# Patient Record
Sex: Female | Born: 1985 | Race: White | Hispanic: No | Smoking: Never smoker
Health system: Southern US, Community
[De-identification: ages and names within clinical notes are randomized; demographics above are authoritative.]

## PROBLEM LIST (undated history)

## (undated) DIAGNOSIS — T7421XA Adult sexual abuse, confirmed, initial encounter: Secondary | ICD-10-CM

## (undated) DIAGNOSIS — J309 Allergic rhinitis, unspecified: Secondary | ICD-10-CM

## (undated) DIAGNOSIS — J45909 Unspecified asthma, uncomplicated: Secondary | ICD-10-CM

## (undated) DIAGNOSIS — F419 Anxiety disorder, unspecified: Secondary | ICD-10-CM

## (undated) DIAGNOSIS — T7840XA Allergy, unspecified, initial encounter: Secondary | ICD-10-CM

## (undated) DIAGNOSIS — R45851 Suicidal ideations: Secondary | ICD-10-CM

## (undated) DIAGNOSIS — N809 Endometriosis, unspecified: Secondary | ICD-10-CM

## (undated) DIAGNOSIS — T1491XA Suicide attempt, initial encounter: Secondary | ICD-10-CM

## (undated) DIAGNOSIS — T7412XA Child physical abuse, confirmed, initial encounter: Secondary | ICD-10-CM

## (undated) DIAGNOSIS — F32A Depression, unspecified: Secondary | ICD-10-CM

## (undated) HISTORY — PX: TOTAL ABDOMINAL HYSTERECTOMY W/ BILATERAL SALPINGOOPHORECTOMY: SHX83

## (undated) HISTORY — PX: DILATION AND CURETTAGE OF UTERUS: SHX78

## (undated) HISTORY — DX: Unspecified asthma, uncomplicated: J45.909

## (undated) HISTORY — DX: Allergy, unspecified, initial encounter: T78.40XA

## (undated) HISTORY — DX: Suicide attempt, initial encounter: T14.91XA

## (undated) HISTORY — PX: OTHER SURGICAL HISTORY: SHX169

## (undated) HISTORY — DX: Anxiety disorder, unspecified: F41.9

---
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
## 2006-01-20 ENCOUNTER — Emergency Department (HOSPITAL_COMMUNITY): Admission: EM | Admit: 2006-01-20 | Discharge: 2006-01-20 | Payer: Self-pay | Admitting: Emergency Medicine

## 2006-07-27 ENCOUNTER — Emergency Department (HOSPITAL_COMMUNITY): Admission: EM | Admit: 2006-07-27 | Discharge: 2006-07-27 | Payer: Self-pay | Admitting: Emergency Medicine

## 2006-07-29 ENCOUNTER — Emergency Department (HOSPITAL_COMMUNITY): Admission: EM | Admit: 2006-07-29 | Discharge: 2006-07-29 | Payer: Self-pay | Admitting: Emergency Medicine

## 2006-08-26 ENCOUNTER — Emergency Department (HOSPITAL_COMMUNITY): Admission: EM | Admit: 2006-08-26 | Discharge: 2006-08-26 | Payer: Self-pay | Admitting: Emergency Medicine

## 2006-09-01 ENCOUNTER — Emergency Department (HOSPITAL_COMMUNITY): Admission: EM | Admit: 2006-09-01 | Discharge: 2006-09-01 | Payer: Self-pay | Admitting: Emergency Medicine

## 2007-05-07 ENCOUNTER — Emergency Department (HOSPITAL_COMMUNITY): Admission: EM | Admit: 2007-05-07 | Discharge: 2007-05-07 | Payer: Self-pay | Admitting: Emergency Medicine

## 2007-06-03 ENCOUNTER — Ambulatory Visit (HOSPITAL_COMMUNITY): Admission: RE | Admit: 2007-06-03 | Discharge: 2007-06-03 | Payer: Self-pay | Admitting: Neurology

## 2007-06-09 ENCOUNTER — Emergency Department (HOSPITAL_COMMUNITY): Admission: EM | Admit: 2007-06-09 | Discharge: 2007-06-09 | Payer: Self-pay | Admitting: Emergency Medicine

## 2007-06-21 ENCOUNTER — Emergency Department (HOSPITAL_COMMUNITY): Admission: EM | Admit: 2007-06-21 | Discharge: 2007-06-21 | Payer: Self-pay | Admitting: Emergency Medicine

## 2007-12-25 ENCOUNTER — Emergency Department (HOSPITAL_COMMUNITY): Admission: EM | Admit: 2007-12-25 | Discharge: 2007-12-26 | Payer: Self-pay | Admitting: Emergency Medicine
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
## 2010-06-28 NOTE — Discharge Summary (Signed)
NAMEWadie Medina NO.:  [id number]   MEDICAL RECORD NO.:  [id number]          PATIENT TYPE:  IPS   LOCATION:  0504                          FACILITY:  [facility]   PHYSICIAN:  Jeanice Lim, M.D. DATE OF BIRTH:  January 25, 1986   DATE OF ADMISSION:  12/13/2004  DATE OF DISCHARGE:  12/17/2004                                 DISCHARGE SUMMARY   IDENTIFYING DATA:  This is an 25 year old single Caucasian female who  apparently advised her mother to take her to the emergency department at  [facility] yesterday.  She reported that she felt that she had a  problem with pills.  Her mother wanted to know if she could not wait until  her appointment at [facility] on December 25, 2004, and patient  stated no.  She was feeling suicidal and admitted in the emergency room of a  plan to wreck her car.   PAST PSYCHIATRIC HISTORY:  Conflicting information.  Reports she has been in  mental health since age 33 and has seen Dr. Wynonia Lawman and Alesia Banda, his therapist.  She was prescribed Remeron and trazodone in the past.   ALCOHOL AND DRUG HISTORY:  Codeine, using at age 70, 2-3 times per week.  Hydrocodone, Adderall, CCC, hydrocodone off the street.  Abusing multiple  medications and pills.   PRIMARY CARE PHYSICIAN:  Dr. Charm Barges in [place].   MEDICATIONS:  Denied being prescribed any at this time.   ALLERGIES:  No known drug allergies.   PHYSICAL EXAMINATION:  Physical and neurologic exam essentially within  normal limits.   MENTAL STATUS EXAM:  Mood somewhat depressed, anxious.  Fair hygiene, no  psychomotor abnormalities except for some restlessness.  Thought process  goal directed.  Judgment and insight are fair.  Patient reported suicidal  thoughts, reported that she states God talks to her at times and maybe has a  history of mood swings.  Cognition was intact.  Judgment and insight were  impaired.   ADMISSION DIAGNOSES:  AXIS I:  Possible bipolar disorder,  type 2.  Questionable post traumatic stress disorder.  Rule out depression not  otherwise specified.  Polysubstance abuse.  Possible substance-induced mood  disorder.  AXIS II:  Deferred.  AXIS III:  History of a headache.  AXIS IV:  Moderate stressors with educational, occupational, housing and  primary support system issues.  AXIS V:  30/55.   HOSPITAL COURSE:  Patient was admitted, ordered routine p.r.n. medications,  underwent further monitoring, was encouraged to participate in individual,  group and milieu therapy.  Patient was optimized on medications and started  on Lamictal targeting mood instability and recurrent mood episodes and clear  history of mood swings which the patient reports, as well as low-dose  Risperdal which is decreased further to minimize sedation.  Patient reported  a positive response to medication changes and crisis intervention.  She  showed improvement in insight, was appropriate on the unit participation in  group, sleeping and eating improved.  Patient was discharged in stable  condition.  Mood was euthymic.  Affect full.  No dangerous ideation or  psychotic symptoms, feeling motivated to get into a substance abuse  treatment program, had to go through a substance abuse assessment now at  [facility] on the day after discharge and then was  planning on supervision from the family until patient could get directly  into a residential program.  Patient's ambivalence and prognosis are  somewhat guarded since she is not directly going to the program.  She is  aware of this risk; however, there is no other way to get around the  community resources.  Patient was given medication education including risk  of rash and to stop medication immediately and call doctor if this occurs  due to risk of Steven-Johnson syndrome with Lamictal.   DISCHARGE MEDICATIONS:  1.  Lamictal 25 mg q.a.m. x 1 week and then 2 tablets q.a.m.  2.  Risperdal 0.25  mg at 8:00 p.m.  3.  Motrin 600 mg 1 q.12 h. p.r.n. pain.  4.  Vistaril 50 mg q.8 h. p.r.n. severe anxiety.   DISPOSITION:  Patient was discharged in improved condition.   DISCHARGE DIAGNOSES:  AXIS I:  Possible bipolar disorder, type 2.  Questionable post traumatic stress disorder.  Rule out depression not  otherwise specified.  Polysubstance abuse.  Possible substance-induced mood  disorder.  AXIS II:  Deferred.  AXIS III:  History of a headache.  AXIS IV:  Moderate stressors with educational, occupational, housing and  primary support system issues.  AXIS V:  Global assessment of function on discharge 55.      Jeanice Lim, M.D.  Electronically Signed     JEM/MEDQ  D:  12/21/2004  T:  12/22/2004  Job:  [id number]

## 2010-06-28 NOTE — Discharge Summary (Signed)
NAMEWadie Medina NO.:  [id number]   MEDICAL RECORD NO.:  [id number]          PATIENT TYPE:  IPS   LOCATION:  0307                          FACILITY:  [facility]   PHYSICIAN:  Jeanice Lim, M.D. DATE OF BIRTH:  1985/12/24   DATE OF ADMISSION:  12/27/2004  DATE OF DISCHARGE:  12/30/2004                                 DISCHARGE SUMMARY   IDENTIFYING DATA:  This is an 25 year old single Caucasian female  voluntarily admitted.  Presented with a history of a breakdown.  Overwhelmed, having thoughts of suicide, cutting self with a razor.  Had a  history of Adderall, triple C and polysubstance abuse with pills, Ambien,  anything that she could get.  She would snort these and take as many as she  can get, take them from her family, get them from friends and urine drug  screen was positive for benzodiazepines although she has no explanation for  this.  It is possible that her detox previously, a week and a half to two  weeks ago, that the long-acting Librium may still have been in her system  but this is unclear.  The patient does emphatically deny having relapsed.  However, came back into the hospital due to mood instability and had not  started intensive outpatient or substance abuse residential treatment due to  the county having slowly set this up.  Her first IOP was to be scheduled  this coming Monday and unclear when she will get into residential if she  will get in.  This is the second admission again at [facility].  Detoxed previously.  History of cutting self in the past.  Lives  with foster parents, in first semester.  Wanted to get out but needs to have  money to move and live in dorm.  The patient continued to report motivation  to remain abstinent and described some mood instability with primary  depressive symptoms since being discharge.  She did well on __________ but  then the bottom dropped out.  Again, denied any substance use and  patient  did appear to be motivated to remain abstinent although her support system  and living situation as well as coping skills and tools at this point  regarding a relapse prevention plan are somewhat questionable and patient's  prognosis would be guarded in light of lack of a more intensive substance  use treatment plan being available such as residential treatment.   PHYSICAL EXAMINATION:  The patient had a physical and neurologic exam done  at [facility] essentially within normal limits except superficial  laceration to right arm.   MENTAL STATUS EXAM:  Alert, cooperative, young female.  Casually dressed.  Speech clear.  Mood and affect anxious.  Appearing depressed.  Thought  processes goal directed.  No overt psychosis.  No acute suicidal or  homicidal ideation.  Judgment and insight were impaired and patient was a  somewhat poor historian with a history of very poor impulse control.   ADMISSION DIAGNOSES:  AXIS I:  Bipolar disorder, type 2.  Possible attention-  deficit disorder.  Polysubstance  abuse in early remission.  AXIS II:  Deferred.  AXIS III:  None.  AXIS IV:  Moderate (problems with education, psychosocial issues and support  system was quite limited).  AXIS V:  35/60.   HOSPITAL COURSE:  The patient was admitted and resumed on medications after  reconciliation and monitored for safety.  Medications optimized.  The  patient participated in dual diagnosis to develop healthier coping skills  and to work on a schedule and relapse prevention plan which will allow her  to remain abstinent but stable outside of the hospital.  Again, mental  health center was pursued regarding the possibility of getting her into  residential program but this was not available for her to go directly go  into.  Therefore, she would be starting intensive outpatient the day after  discharge.  She was angry at her mother for telling her that she would take  her to NA meetings, was  angry that she was not able to take the car other  places which of course would not be advisable for this patient and clearly  needs significant supervision based on her maturity level and just early  stage of attempting to learn how to maintain sobriety.  The patient  responded to crisis intervention and stabilization.   CONDITION ON DISCHARGE:  Discharged in improved condition.  Mood was  euthymic.  Affect bright.  The patient appeared to be able to cope, was  calm, had a plan, a busy schedule and routine and reported motivated to  remain abstinent and was willing to go to residential as soon as one might  be available for substance abuse treatment.  Therefore, the patient was  discharged in improved condition with no risk issues, not requiring detox, a  second admission.  Medications had been optimized and patient was  recommended to have medication management for mood instability in addition  to therapy in addition to substance abuse treatment which were arranged  prior to discharge.  She was discharged in improved condition.   DISCHARGE DIAGNOSES:  AXIS I:  Bipolar disorder, type 2.  Possible attention-  deficit disorder.  Polysubstance abuse in early remission.  AXIS II:  Deferred.  AXIS III:  None.  AXIS IV:  Moderate (problems with education, psychosocial issues and support  system was quite limited).  AXIS V:  GAF on discharge 60.      Jeanice Lim, M.D.  Electronically Signed     JEM/MEDQ  D:  01/05/2005  T:  01/05/2005  Job:  [id number]

## 2010-11-04 LAB — WET PREP, GENITAL
Trich, Wet Prep: NONE SEEN
Yeast Wet Prep HPF POC: NONE SEEN

## 2010-11-04 LAB — URINALYSIS, ROUTINE W REFLEX MICROSCOPIC
Bilirubin Urine: NEGATIVE
Glucose, UA: NEGATIVE
Hgb urine dipstick: NEGATIVE
Ketones, ur: NEGATIVE
Specific Gravity, Urine: 1.025
pH: 6

## 2010-11-04 LAB — URINE MICROSCOPIC-ADD ON

## 2010-11-04 LAB — GC/CHLAMYDIA PROBE AMP, GENITAL
Chlamydia, DNA Probe: NEGATIVE
GC Probe Amp, Genital: NEGATIVE

## 2010-11-04 LAB — PREGNANCY, URINE: Preg Test, Ur: NEGATIVE

## 2010-11-13 LAB — URINALYSIS, ROUTINE W REFLEX MICROSCOPIC
Glucose, UA: NEGATIVE
Ketones, ur: 40 — AB
Protein, ur: 100 — AB
pH: 6

## 2010-11-13 LAB — URINE CULTURE

## 2010-11-13 LAB — URINE MICROSCOPIC-ADD ON

## 2010-11-25 LAB — URINALYSIS, ROUTINE W REFLEX MICROSCOPIC
Hgb urine dipstick: NEGATIVE
Ketones, ur: 15 — AB
Leukocytes, UA: NEGATIVE
Nitrite: POSITIVE — AB
Protein, ur: 30 — AB
Specific Gravity, Urine: 1.02
Urobilinogen, UA: 0.2
Urobilinogen, UA: 4 — ABNORMAL HIGH

## 2010-11-25 LAB — GC/CHLAMYDIA PROBE AMP, GENITAL: GC Probe Amp, Genital: NEGATIVE

## 2010-11-25 LAB — URINE CULTURE: Colony Count: 6000

## 2010-11-25 LAB — CBC
HCT: 38.4
Hemoglobin: 13
MCV: 86.6
Platelets: 259
RBC: 4.44
WBC: 7.6

## 2010-11-25 LAB — COMPREHENSIVE METABOLIC PANEL
Albumin: 4.1
Alkaline Phosphatase: 62
BUN: 8
CO2: 28
Chloride: 103
GFR calc non Af Amer: >60
Potassium: 4.4
Total Bilirubin: 0.7

## 2010-11-25 LAB — DIFFERENTIAL
Basophils Absolute: 0
Basophils Relative: 1
Eosinophils Relative: 0
Monocytes Absolute: 0.4
Neutro Abs: 6.3

## 2010-11-25 LAB — PREGNANCY, URINE: Preg Test, Ur: POSITIVE

## 2010-11-25 LAB — HCG, QUANTITATIVE, PREGNANCY: hCG, Beta Chain, Quant, S: 45032 — ABNORMAL HIGH

## 2010-11-25 LAB — URINE MICROSCOPIC-ADD ON

## 2010-11-25 LAB — WET PREP, GENITAL: Yeast Wet Prep HPF POC: NONE SEEN

## 2010-11-27 LAB — URINE MICROSCOPIC-ADD ON

## 2010-11-27 LAB — URINALYSIS, ROUTINE W REFLEX MICROSCOPIC
Glucose, UA: NEGATIVE
Ketones, ur: NEGATIVE
Protein, ur: 100 — AB

## 2020-02-25 ENCOUNTER — Inpatient Hospital Stay
Admission: EM | Admit: 2020-02-25 | Discharge: 2020-02-29 | DRG: 917 | Disposition: A | Discharge status: Home / Self Care | Payer: Self-pay | Attending: Internal Medicine | Admitting: Internal Medicine

## 2020-02-25 ENCOUNTER — Encounter: Payer: Self-pay | Admitting: Internal Medicine

## 2020-02-25 ENCOUNTER — Other Ambulatory Visit: Payer: Self-pay

## 2020-02-25 DIAGNOSIS — T1491XA Suicide attempt, initial encounter: Secondary | ICD-10-CM

## 2020-02-25 DIAGNOSIS — F331 Major depressive disorder, recurrent, moderate: Secondary | ICD-10-CM

## 2020-02-25 DIAGNOSIS — F332 Major depressive disorder, recurrent severe without psychotic features: Secondary | ICD-10-CM

## 2020-02-25 DIAGNOSIS — R45851 Suicidal ideations: Secondary | ICD-10-CM

## 2020-02-25 DIAGNOSIS — F32A Depression, unspecified: Secondary | ICD-10-CM

## 2020-02-25 DIAGNOSIS — F603 Borderline personality disorder: Secondary | ICD-10-CM | POA: Diagnosis present

## 2020-02-25 DIAGNOSIS — F4310 Post-traumatic stress disorder, unspecified: Secondary | ICD-10-CM | POA: Diagnosis present

## 2020-02-25 DIAGNOSIS — Z90710 Acquired absence of both cervix and uterus: Secondary | ICD-10-CM

## 2020-02-25 DIAGNOSIS — T391X2A Poisoning by 4-Aminophenol derivatives, intentional self-harm, initial encounter: Principal | ICD-10-CM | POA: Diagnosis present

## 2020-02-25 DIAGNOSIS — Z9151 Personal history of suicidal behavior: Secondary | ICD-10-CM

## 2020-02-25 DIAGNOSIS — T50902A Poisoning by unspecified drugs, medicaments and biological substances, intentional self-harm, initial encounter: Secondary | ICD-10-CM

## 2020-02-25 DIAGNOSIS — Z79890 Hormone replacement therapy (postmenopausal): Secondary | ICD-10-CM

## 2020-02-25 DIAGNOSIS — Z79899 Other long term (current) drug therapy: Secondary | ICD-10-CM

## 2020-02-25 DIAGNOSIS — U071 COVID-19: Secondary | ICD-10-CM | POA: Diagnosis present

## 2020-02-25 DIAGNOSIS — Z5901 Sheltered homelessness: Secondary | ICD-10-CM

## 2020-02-25 DIAGNOSIS — Z91410 Personal history of adult physical and sexual abuse: Secondary | ICD-10-CM

## 2020-02-25 HISTORY — DX: Allergic rhinitis, unspecified: J30.9

## 2020-02-25 HISTORY — DX: Depression, unspecified: F32.A

## 2020-02-25 HISTORY — DX: Adult sexual abuse, confirmed, initial encounter: T74.21XA

## 2020-02-25 HISTORY — DX: Child physical abuse, confirmed, initial encounter: T74.12XA

## 2020-02-25 HISTORY — DX: Suicidal ideations: R45.851

## 2020-02-25 HISTORY — DX: Endometriosis, unspecified: N80.9

## 2020-02-25 LAB — CBC WITH DIFFERENTIAL/PLATELET
Abs Immature Granulocytes: 0.02 10*3/uL (ref 0.00–0.07)
Basophils Absolute: 0 10*3/uL (ref 0.0–0.1)
Basophils Relative: 0 %
Eosinophils Absolute: 0.1 10*3/uL (ref 0.0–0.5)
Eosinophils Relative: 1 %
HCT: 38.5 % (ref 36.0–46.0)
Hemoglobin: 12.8 g/dL (ref 12.0–15.0)
Immature Granulocytes: 0 %
Lymphocytes Relative: 30 %
Lymphs Abs: 1.6 10*3/uL (ref 0.7–4.0)
MCH: 29.8 pg (ref 26.0–34.0)
MCHC: 33.2 g/dL (ref 30.0–36.0)
MCV: 89.7 fL (ref 80.0–100.0)
Monocytes Absolute: 0.5 10*3/uL (ref 0.1–1.0)
Monocytes Relative: 9 %
Neutro Abs: 3.3 10*3/uL (ref 1.7–7.7)
Neutrophils Relative %: 60 %
Platelets: 223 10*3/uL (ref 150–400)
RBC: 4.29 MIL/uL (ref 3.87–5.11)
RDW: 12.3 % (ref 11.5–15.5)
WBC: 5.5 10*3/uL (ref 4.0–10.5)
nRBC: 0 % (ref 0.0–0.2)

## 2020-02-25 LAB — BLOOD GAS, VENOUS
Acid-Base Excess: 3.5 mmol/L — ABNORMAL HIGH (ref 0.0–2.0)
Bicarbonate: 29.1 mmol/L — ABNORMAL HIGH (ref 20.0–28.0)
O2 Saturation: 78.6 %
Patient temperature: 37
pCO2, Ven: 47 mmHg (ref 44.0–60.0)
pH, Ven: 7.4 (ref 7.250–7.430)
pO2, Ven: 43 mmHg (ref 32.0–45.0)

## 2020-02-25 LAB — COMPREHENSIVE METABOLIC PANEL
ALT: 30 U/L (ref 0–44)
AST: 33 U/L (ref 15–41)
Albumin: 4.3 g/dL (ref 3.5–5.0)
Alkaline Phosphatase: 81 U/L (ref 38–126)
Anion gap: 13 (ref 5–15)
BUN: 14 mg/dL (ref 6–20)
CO2: 25 mmol/L (ref 22–32)
Calcium: 9.4 mg/dL (ref 8.9–10.3)
Chloride: 103 mmol/L (ref 98–111)
Creatinine, Ser: 0.72 mg/dL (ref 0.44–1.00)
GFR, Estimated: >60 mL/min (ref >60)
Glucose, Bld: 142 mg/dL — ABNORMAL HIGH (ref 70–99)
Potassium: 3.4 mmol/L — ABNORMAL LOW (ref 3.5–5.1)
Sodium: 141 mmol/L (ref 135–145)
Total Bilirubin: 0.6 mg/dL (ref 0.3–1.2)
Total Protein: 7.9 g/dL (ref 6.5–8.1)

## 2020-02-25 LAB — RESP PANEL BY RT-PCR (FLU A&B, COVID) ARPGX2
Influenza A by PCR: NEGATIVE
Influenza B by PCR: NEGATIVE
SARS Coronavirus 2 by RT PCR: POSITIVE — AB

## 2020-02-25 LAB — URINALYSIS, COMPLETE (UACMP) WITH MICROSCOPIC
Bacteria, UA: NONE SEEN
Bilirubin Urine: NEGATIVE
Glucose, UA: NEGATIVE mg/dL
Hgb urine dipstick: NEGATIVE
Ketones, ur: NEGATIVE mg/dL
Leukocytes,Ua: NEGATIVE
Nitrite: NEGATIVE
Protein, ur: NEGATIVE mg/dL
Specific Gravity, Urine: 1.015 (ref 1.005–1.030)
pH: 5 (ref 5.0–8.0)

## 2020-02-25 LAB — URINE DRUG SCREEN, QUALITATIVE (ARMC ONLY)
Amphetamines, Ur Screen: NOT DETECTED
Barbiturates, Ur Screen: NOT DETECTED
Benzodiazepine, Ur Scrn: NOT DETECTED
Cannabinoid 50 Ng, Ur ~~LOC~~: NOT DETECTED
Cocaine Metabolite,Ur ~~LOC~~: NOT DETECTED
MDMA (Ecstasy)Ur Screen: NOT DETECTED
Methadone Scn, Ur: NOT DETECTED
Opiate, Ur Screen: NOT DETECTED
Phencyclidine (PCP) Ur S: NOT DETECTED
Tricyclic, Ur Screen: NOT DETECTED

## 2020-02-25 LAB — ACETAMINOPHEN LEVEL
Acetaminophen (Tylenol), Serum: 220 ug/mL (ref 10–30)
Acetaminophen (Tylenol), Serum: 273 ug/mL (ref 10–30)

## 2020-02-25 LAB — PREGNANCY, URINE: Preg Test, Ur: NEGATIVE

## 2020-02-25 LAB — POC URINE PREG, ED: Preg Test, Ur: NEGATIVE

## 2020-02-25 LAB — LACTIC ACID, PLASMA: Lactic Acid, Venous: 2.6 mmol/L (ref 0.5–1.9)

## 2020-02-25 LAB — SALICYLATE LEVEL
Salicylate Lvl: <7 mg/dL — ABNORMAL LOW (ref 7.0–30.0)
Salicylate Lvl: <7 mg/dL — ABNORMAL LOW (ref 7.0–30.0)

## 2020-02-25 MED ORDER — ONDANSETRON HCL 4 MG/2ML IJ SOLN
4.0000 mg | Freq: Once | INTRAMUSCULAR | Status: AC
  Administered 2020-02-25: 4 mg via INTRAVENOUS
  Filled 2020-02-25: qty 2

## 2020-02-25 MED ORDER — ACETYLCYSTEINE LOAD VIA INFUSION: 150.0000 mg/kg | Freq: Once | INTRAVENOUS | Status: DC

## 2020-02-25 MED ORDER — CITALOPRAM HYDROBROMIDE 20 MG PO TABS
20.0000 mg | ORAL_TABLET | Freq: Every day | ORAL | Status: DC
  Administered 2020-02-26 – 2020-02-29 (×4): 20 mg via ORAL
  Filled 2020-02-25 (×4): qty 1

## 2020-02-25 MED ORDER — ESTRADIOL 1 MG PO TABS: 0.5000 mg | ORAL_TABLET | Freq: Every day | ORAL | Status: DC

## 2020-02-25 MED ORDER — ACETYLCYSTEINE LOAD VIA INFUSION
150.0000 mg/kg | Freq: Once | INTRAVENOUS | Status: AC
  Administered 2020-02-26: 10890 mg via INTRAVENOUS
  Filled 2020-02-25: qty 273

## 2020-02-25 MED ORDER — NALOXONE HCL 2 MG/2ML IJ SOSY
PREFILLED_SYRINGE | INTRAMUSCULAR | Status: AC
  Filled 2020-02-25: qty 2

## 2020-02-25 MED ORDER — DEXTROSE 5 % IV SOLN
15.0000 mg/kg/h | INTRAVENOUS | Status: DC
  Administered 2020-02-26 (×2): 15 mg/kg/h via INTRAVENOUS
  Filled 2020-02-25 (×2): qty 120

## 2020-02-25 MED ORDER — ESTRADIOL 1 MG PO TABS
1.0000 mg | ORAL_TABLET | Freq: Every day | ORAL | Status: DC
  Administered 2020-02-26 – 2020-02-29 (×4): 1 mg via ORAL
  Filled 2020-02-25 (×4): qty 1

## 2020-02-25 MED ORDER — SODIUM CHLORIDE 0.9 % IV BOLUS
1000.0000 mL | Freq: Once | INTRAVENOUS | Status: AC
  Administered 2020-02-25: 1000 mL via INTRAVENOUS

## 2020-02-25 NOTE — ED Notes (Signed)
Date and time results received: 02/25/20 11:53 PM   Test: Covid Critical Value: positive  Name of Provider Notified: Norins MD

## 2020-02-25 NOTE — ED Notes (Signed)
Pt brought to room in wheelchair with PD. Unresponsive with eyes open. Blink reflex intact. Would not respond to painful stimuli. EDP at bedside.

## 2020-02-25 NOTE — ED Triage Notes (Signed)
Pt here with PD under IVC. Per PD pt was witnessed taking "a few handfuls of Tylenol."

## 2020-02-25 NOTE — ED Notes (Signed)
Pt placed on bedpan. Bedpan spilled. Pt placed in clean dry gown, linens changed. Urine collected and sent to lab

## 2020-02-25 NOTE — ED Notes (Signed)
Pt talking and answering questions

## 2020-02-25 NOTE — ED Provider Notes (Signed)
[facility] Emergency Department Provider Note   ____________________________________________   None    (approximate)  I have reviewed the triage vital signs and the nursing notes.   HISTORY  Chief Complaint Drug Overdose  Chief complaint is overdose  HPI Rebecca Medina is a 35 y.o. female brought in by police.  Patient was seen by friends to take a large amount of Tylenol police were called at 13.  This overdose happened just before that.  Patient is now staring at ceiling will blink to threat and occasionally will pick her arm up and wipe her mouth where she was drooling but otherwise does not want to talk.         No past medical history on file.  There are no problems to display for this patient.     Prior to Admission medications   Not on File    Allergies Patient has no allergy information on record.  No family history on file.  Social History    Review of Systems Unable to get this part of the HPI because of patient's lack of responsiveness  ____________________________________________   PHYSICAL EXAM:  VITAL SIGNS: ED Triage Vitals  Enc Vitals Group     BP      Pulse      Resp      Temp      Temp src      SpO2      Weight      Height      Head Circumference      Peak Flow      Pain Score      Pain Loc      Pain Edu?      Excl. in GC?     Constitutional: Patient's eyes are open she blinks to threat but otherwise will not talk Eyes: Conjunctivae are normal. PERRL. EOMI. Head: Atraumatic. Nose: No congestion/rhinnorhea. Mouth/Throat: Mucous membranes are moist.  Oropharynx non-erythematous. Neck: No stridor.  Cardiovascular: Normal rate, regular rhythm. Grossly normal heart sounds.  Good peripheral circulation. Respiratory: Normal respiratory effort.  No retractions. Lungs CTAB. Gastrointestinal: Soft and nontender. No distention. No abdominal bruits.  Musculoskeletal: No lower extremity tenderness nor  edema.   Neurologic: See HPI patient does not respond to pain Skin:  Skin is warm, dry and intact. No rash noted.   ____________________________________________   LABS (all labs ordered are listed, but only abnormal results are displayed)  Labs Reviewed  COMPREHENSIVE METABOLIC PANEL - Abnormal; Notable for the following components:      Result Value   Potassium 3.4 (*)    Glucose, Bld 142 (*)    All other components within normal limits  SALICYLATE LEVEL - Abnormal; Notable for the following components:   Salicylate Lvl <7.0 (*)    All other components within normal limits  ACETAMINOPHEN LEVEL - Abnormal; Notable for the following components:   Acetaminophen (Tylenol), Serum 220 (*)    All other components within normal limits  LACTIC ACID, PLASMA - Abnormal; Notable for the following components:   Lactic Acid, Venous 2.6 (*)    All other components within normal limits  BLOOD GAS, VENOUS - Abnormal; Notable for the following components:   Bicarbonate 29.1 (*)    Acid-Base Excess 3.5 (*)    All other components within normal limits  ACETAMINOPHEN LEVEL - Abnormal; Notable for the following components:   Acetaminophen (Tylenol), Serum 273 (*)    All other components within normal limits  SALICYLATE LEVEL -  Abnormal; Notable for the following components:   Salicylate Lvl <7.0 (*)    All other components within normal limits  RESP PANEL BY RT-PCR (FLU A&B, COVID) ARPGX2  CBC WITH DIFFERENTIAL/PLATELET  LACTIC ACID, PLASMA  URINE DRUG SCREEN, QUALITATIVE ([facility] ONLY)  URINALYSIS, COMPLETE (UACMP) WITH MICROSCOPIC  PREGNANCY, URINE  POC URINE PREG, ED   ____________________________________________  EKG   ____________________________________________  RADIOLOGY Jill Poling, personally viewed and evaluated these images (plain radiographs) as part of my medical decision making, as well as reviewing the written report by the radiologist.  ED MD interpretation:    Official radiology report(s): No results found.  ____________________________________________   PROCEDURES  Procedure(s) performed (including Critical Care): Critical care time 45 minutes.  This includes talking to the patient at about 1045 and initially evaluating the patient when she would not talk.  I also talked to poison control twice and discussed the care with the nurse and then I am going to discussed care with the hospitalist.  Procedures   ____________________________________________   INITIAL IMPRESSION / ASSESSMENT AND PLAN / ED COURSE  Because of overdose and apparent noncompliance I will take out commitment papers Poison control contacted with 2-hour levels. They suggest waiting for 4-hour levels before we do anything.    The patient has been placed in psychiatric observation due to the need to provide a safe environment for the patient while obtaining psychiatric consultation and evaluation, as well as ongoing medical and medication management to treat the patient's condition.  The patient has been placed under full IVC at this time.   Patient will need medical admission.  Her 4-hour Tylenol level is 273.  Discussed this with the [facility]  Patient has been vomiting.  We will give her 150 mg/kg bolus over an hour and then a 15 mg/kg/h bolus for 23 hours.  Repeat labs 1 hour before the drip ends.  Patient is under psychiatric commitment.  When she is medically cleared she will need psychiatric evaluation.      ____________________________________________   FINAL CLINICAL IMPRESSION(S) / ED DIAGNOSES  Final diagnoses:  Intentional drug overdose, initial encounter (HCC)  Suicide attempt (HCC)  Tylenol overdose, intentional self-harm, initial encounter [facility])     ED Discharge Orders    None      *Please note:  Rebecca Medina was evaluated in Emergency Department on 02/25/2020 for the symptoms described in the history of present illness. She was  evaluated in the context of the global COVID-19 pandemic, which necessitated consideration that the patient might be at risk for infection with the SARS-CoV-2 virus that causes COVID-19. Institutional protocols and algorithms that pertain to the evaluation of patients at risk for COVID-19 are in a state of rapid change based on information released by regulatory bodies including the CDC and federal and state organizations. These policies and algorithms were followed during the patient's care in the ED.  Some ED evaluations and interventions may be delayed as a result of limited staffing during and the pandemic.*   Note:  This document was prepared using Dragon voice recognition software and may include unintentional dictation errors.    Arnaldo Natal, MD 02/25/20 2303

## 2020-02-25 NOTE — ED Notes (Signed)
Malinda MD notified of critical lab values

## 2020-02-25 NOTE — ED Notes (Addendum)
Spoke with Tammy Sours, [phone], pt friend who witnessed OD. Friend states he witnessed patient take "a large handful" of 500 mg acetaminophen tablets. States pt was alone for "a minute or two, so she might have taken more." Pt reports "nothing else was out of place, so I don't think she took anything else."   Unable to update Greg on pt status due to pt unable to consent. Will attempt to have patient call friend when pt is more alert

## 2020-02-26 ENCOUNTER — Inpatient Hospital Stay: Payer: Self-pay

## 2020-02-26 DIAGNOSIS — F32A Depression, unspecified: Secondary | ICD-10-CM

## 2020-02-26 DIAGNOSIS — U071 COVID-19: Secondary | ICD-10-CM

## 2020-02-26 DIAGNOSIS — T391X2A Poisoning by 4-Aminophenol derivatives, intentional self-harm, initial encounter: Principal | ICD-10-CM

## 2020-02-26 DIAGNOSIS — R45851 Suicidal ideations: Secondary | ICD-10-CM

## 2020-02-26 LAB — LACTATE DEHYDROGENASE: LDH: 130 U/L (ref 98–192)

## 2020-02-26 LAB — PROTIME-INR
INR: 1 (ref 0.8–1.2)
Prothrombin Time: 13 seconds (ref 11.4–15.2)

## 2020-02-26 LAB — LACTIC ACID, PLASMA: Lactic Acid, Venous: 1.4 mmol/L (ref 0.5–1.9)

## 2020-02-26 LAB — ACETAMINOPHEN LEVEL: Acetaminophen (Tylenol), Serum: 82 ug/mL — ABNORMAL HIGH (ref 10–30)

## 2020-02-26 LAB — FIBRIN DERIVATIVES D-DIMER (ARMC ONLY): Fibrin derivatives D-dimer (ARMC): 539.35 ng/mL (FEU) — ABNORMAL HIGH (ref 0.00–499.00)

## 2020-02-26 LAB — FERRITIN: Ferritin: 84 ng/mL (ref 11–307)

## 2020-02-26 LAB — HIV ANTIBODY (ROUTINE TESTING W REFLEX): HIV Screen 4th Generation wRfx: NONREACTIVE

## 2020-02-26 LAB — PROCALCITONIN: Procalcitonin: <0.1 ng/mL

## 2020-02-26 LAB — C-REACTIVE PROTEIN: CRP: 0.6 mg/dL (ref <1.0)

## 2020-02-26 MED ORDER — SODIUM CHLORIDE 0.9 % IV SOLN: INTRAVENOUS | Status: DC

## 2020-02-26 MED ORDER — ONDANSETRON HCL 4 MG/2ML IJ SOLN
4.0000 mg | Freq: Four times a day (QID) | INTRAMUSCULAR | Status: DC | PRN
  Administered 2020-02-26 (×2): 4 mg via INTRAVENOUS
  Filled 2020-02-26 (×2): qty 2

## 2020-02-26 MED ORDER — ENOXAPARIN SODIUM 40 MG/0.4ML ~~LOC~~ SOLN
40.0000 mg | SUBCUTANEOUS | Status: DC
  Administered 2020-02-26 – 2020-02-29 (×4): 40 mg via SUBCUTANEOUS
  Filled 2020-02-26 (×4): qty 0.4

## 2020-02-26 NOTE — ED Notes (Signed)
Spoke with Angelique Blonder from poison control and updated her on patients labs, IV drips, and vital signs. Made aware of lab redraw scheduled for 02/26/2020 at 22:00

## 2020-02-26 NOTE — ED Notes (Addendum)
Patients belongings put in patient belongings bag and walked to [employer] by [employer].  In Patient belongings bags: shirt, pants, apple watch, Iphone and two chargers, wallet with credit cards and debit card, ID

## 2020-02-26 NOTE — ED Notes (Signed)
Helped pt to toilet. 
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
## 2020-02-26 NOTE — Progress Notes (Signed)
Brief hospitalist update note.  This is a nonbillable note.  Please see same-day H&P from Dr. Debby Bud for full billable details.  Briefly, this is a 35 year old female with history significant for depression with a history of suicide attempts via overdose and self-injurious behavior who was brought to the emergency room after taking intentionally large amount of acetaminophen and a presumed suicide attempt after the ending of a relationship.  She is fine incidentally to be COVID-positive.  No pneumonia on chest x-ray.  No fevers, no oxygen requirement.  On my evaluation patient is resting comfortably in bed.  She is in no visible distress.  She is not especially communicative.  She has been started on IV N-acetylcysteine.  Plan: Continue N-acetylcysteine per poison control recommendations Intravenous fluids Antiemetics as needed No specific antiviral therapy at this time We will reach out to psychiatric consultation service.  Message sent to Dr. Jannifer Rodney MD

## 2020-02-26 NOTE — Progress Notes (Addendum)
MEDICATION RELATED CONSULT NOTE   Pharmacy Consult for Acetadote  Indication: Tylenol OD  Not on File  Patient Measurements: Height: 5\' 10"  (177.8 cm) Weight: 72.6 kg (160 lb) IBW/kg (Calculated) : 68.5  Vital Signs: BP: 109/67 (01/16 0645) Pulse Rate: 62 (01/16 0645) Intake/Output from previous day: No intake/output data recorded. Intake/Output from this shift: No intake/output data recorded.  Labs: Recent Labs    02/25/20 1851  WBC 5.5  HGB 12.8  HCT 38.5  PLT 223  CREATININE 0.72  ALBUMIN 4.3  PROT 7.9  AST 33  ALT 30  ALKPHOS 81  BILITOT 0.6   Estimated Creatinine Clearance: 108.2 mL/min (by C-G formula based on SCr of 0.72 mg/dL).  Acetaminophen (Tylenol), Serum 10 - 30 ug/mL 273High Panic   Comment: CRITICAL RESULT CALLED TO, READ BACK BY AND VERIFIED WITH  REED RENO 02/25/20 2247 [facility]  (NOTE)  Therapeutic concentrations vary significantly. A range of 10-30 ug/mL  may be an effective concentration for many patients. However, some  are best treated at concentrations outside of this range.  Acetaminophen concentrations >150 ug/mL at 4 hours after ingestion  and >50 ug/mL at 12 hours after ingestion are often associated with  toxic reactions.   Performed at [facility], [address] [place],  [place]    Medications:  (Not in a hospital admission)  Assessment: Pt here with PD under IVC. Per PD pt was witnessed taking "a few handfuls of Tylenol."  -Covid +  Plan:  Acetadote 150mg /Kg loading dose then 15mg /kg/hr infusion Continue to monitor labs and clinical status  1/15 @ 1851: AST 33/ALT 30 on admission. APAP level=220 INR 1.0 at 1108 1/16 Salicylate level <7.0 (negative) Pregnancy negative Urine tox screen negative. Acetadote IV started 1/16 at 0015 per [facility]  1/16 @ 0526 APAP level= 82. Patient had N/V.  Will order CMP (Bmet, ALT,AST), INR for 2200  (22 hours post Acetadote initiation per protocol). INR this am is  pending draw  Shaqueta Casady A 02/26/2020,8:33 AM
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
## 2020-02-26 NOTE — ED Notes (Signed)
Pt called out to use bathroom. RN entered room and pt was vomiting. Large amount of vomit noted in emesis bag. Pt laying in bed again. Cal bell remains in reach.

## 2020-02-26 NOTE — ED Notes (Signed)
RN spoke with poison control to provide update. No new orders or instruction at this time.

## 2020-02-26 NOTE — Progress Notes (Signed)
MEDICATION RELATED CONSULT NOTE - INITIAL   Pharmacy Consult for Acetadote  Indication: Tylenol OD  Not on File  Patient Measurements: Height: 5\' 10"  (177.8 cm) Weight: 72.6 kg (160 lb) IBW/kg (Calculated) : 68.5  Vital Signs: Temp: 97.8 F (36.6 C) (01/15 1900) Temp Source: Oral (01/15 1900) BP: 109/77 (01/15 2130) Pulse Rate: 69 (01/15 2330) Intake/Output from previous day: No intake/output data recorded. Intake/Output from this shift: No intake/output data recorded.  Labs: Recent Labs    02/25/20 1851  WBC 5.5  HGB 12.8  HCT 38.5  PLT 223  CREATININE 0.72  ALBUMIN 4.3  PROT 7.9  AST 33  ALT 30  ALKPHOS 81  BILITOT 0.6   Estimated Creatinine Clearance: 108.2 mL/min (by C-G formula based on SCr of 0.72 mg/dL).  Acetaminophen (Tylenol), Serum 10 - 30 ug/mL 273High Panic   Comment: CRITICAL RESULT CALLED TO, READ BACK BY AND VERIFIED WITH  REED RENO 02/25/20 2247 [facility]  (NOTE)  Therapeutic concentrations vary significantly. A range of 10-30 ug/mL  may be an effective concentration for many patients. However, some  are best treated at concentrations outside of this range.  Acetaminophen concentrations >150 ug/mL at 4 hours after ingestion  and >50 ug/mL at 12 hours after ingestion are often associated with  toxic reactions.   Performed at [facility], [address] [place],  [place]    Medications:  (Not in a hospital admission)  Assessment: Pt here with PD under IVC. Per PD pt was witnessed taking "a few handfuls of Tylenol."   Plan:  Acetadote 150mg /Kg loading dose then 15mg /kg/hr infusion Continue to monitor labs and clinical status  [id number] A 02/26/2020,12:09 AM

## 2020-02-27 DIAGNOSIS — F332 Major depressive disorder, recurrent severe without psychotic features: Secondary | ICD-10-CM

## 2020-02-27 LAB — COMPREHENSIVE METABOLIC PANEL
ALT: 23 U/L (ref 0–44)
AST: 19 U/L (ref 15–41)
Albumin: 3.3 g/dL — ABNORMAL LOW (ref 3.5–5.0)
Alkaline Phosphatase: 54 U/L (ref 38–126)
Anion gap: 7 (ref 5–15)
BUN: 6 mg/dL (ref 6–20)
CO2: 26 mmol/L (ref 22–32)
Calcium: 8.4 mg/dL — ABNORMAL LOW (ref 8.9–10.3)
Chloride: 106 mmol/L (ref 98–111)
Creatinine, Ser: 0.69 mg/dL (ref 0.44–1.00)
GFR, Estimated: >60 mL/min (ref >60)
Glucose, Bld: 98 mg/dL (ref 70–99)
Potassium: 2.9 mmol/L — ABNORMAL LOW (ref 3.5–5.1)
Sodium: 139 mmol/L (ref 135–145)
Total Bilirubin: 0.7 mg/dL (ref 0.3–1.2)
Total Protein: 6.4 g/dL — ABNORMAL LOW (ref 6.5–8.1)

## 2020-02-27 LAB — ACETAMINOPHEN LEVEL: Acetaminophen (Tylenol), Serum: <10 ug/mL — ABNORMAL LOW (ref 10–30)

## 2020-02-27 LAB — PROTIME-INR
INR: 1.3 — ABNORMAL HIGH (ref 0.8–1.2)
INR: 1.3 — ABNORMAL HIGH (ref 0.8–1.2)
Prothrombin Time: 15.6 seconds — ABNORMAL HIGH (ref 11.4–15.2)
Prothrombin Time: 15.9 seconds — ABNORMAL HIGH (ref 11.4–15.2)

## 2020-02-27 LAB — C-REACTIVE PROTEIN: CRP: <0.5 mg/dL (ref <1.0)

## 2020-02-27 LAB — MAGNESIUM: Magnesium: 1.8 mg/dL (ref 1.7–2.4)

## 2020-02-27 MED ORDER — POTASSIUM CHLORIDE 10 MEQ/100ML IV SOLN: 10.0000 meq | INTRAVENOUS | Status: DC

## 2020-02-27 MED ORDER — POTASSIUM CHLORIDE CRYS ER 20 MEQ PO TBCR
40.0000 meq | EXTENDED_RELEASE_TABLET | Freq: Once | ORAL | Status: AC
  Administered 2020-02-27: 40 meq via ORAL
  Filled 2020-02-27: qty 2

## 2020-02-27 MED ORDER — POTASSIUM CHLORIDE 10 MEQ/100ML IV SOLN
10.0000 meq | INTRAVENOUS | Status: AC
  Administered 2020-02-27 (×4): 10 meq via INTRAVENOUS
  Filled 2020-02-27 (×2): qty 100

## 2020-02-27 MED ORDER — POTASSIUM CHLORIDE CRYS ER 20 MEQ PO TBCR: 40.0000 meq | EXTENDED_RELEASE_TABLET | Freq: Two times a day (BID) | ORAL | Status: DC

## 2020-02-27 NOTE — ED Notes (Signed)
Spoke with Caryn Bee at poison control. Caryn Bee recommends DC'ing acetylcysteine d/t negative tylenol level.

## 2020-02-27 NOTE — ED Notes (Signed)
IVC  PENDING  PLACEMENT  CONSULT  DONE 

## 2020-02-27 NOTE — Progress Notes (Signed)
PROGRESS NOTE    Rebecca Medina  OMV:[id number] DOB: [birth year] DOA: 02/25/2020 PCP: Pcp, No    Brief Narrative:  35 year old female with history significant for depression with a history of suicide attempts via overdose and self-injurious behavior who was brought to the emergency room after taking intentionally large amount of acetaminophen and a presumed suicide attempt after the ending of a relationship.  She is fine incidentally to be COVID-positive.  No pneumonia on chest x-ray.  No fevers, no oxygen requirement.  On my evaluation patient is resting comfortably in bed.  She is in no visible distress.  She is not especially communicative.  She has been started on IV N-acetylcysteine.  APAP level negative.  Poison control contacted with instructions to discontinue N-acetylcysteine.  Patient remains hemodynamically stable.  Seen by psychiatry who recommends inpatient psychiatric admission after medical clearance.  If patient tolerates oral intake she will be medically ready for disposition to behavioral health unit on 02/28/2020   Assessment & Plan:   Active Problems:   Tylenol overdose, intentional self-harm, initial encounter (HCC)   Depression with suicidal ideation   COVID-19 virus infection  APAP overdose Suicide attempt Patient has an extensive psychiatric history with history of suicide attempts in the past Reported that she took an unknown amount of Tylenol witnessed by friends Seen by psychiatry, recommendations appreciated Poison control contacted and patient started on N-acetylcysteine NAC infusion now discontinued with negative APAP level LFTs within normal limits Plan: N-acetylcysteine discontinued Intravenous fluids Diet as tolerated Antiemetics as needed If patient continues to tolerate p.o. intake she will be medically ready for disposition to inpatient behavioral unit on 02/28/2020  COVID-19 infection Is asymptomatic No pneumonia on chest x-ray, no fevers  no oxygen requirement Plan: Airborne and contact precautions No indication for remdesivir or steroids at this time Vitals per unit protocol, oxygen if needed  Major depressive disorder Continue Celexa Suicide precautions Psych consult   DVT prophylaxis: SQ Lovenox Code Status: Full Family Communication: None  disposition Plan: Status is: Inpatient  Remains inpatient appropriate because:Inpatient level of care appropriate due to severity of illness   Dispo: The patient is from: Home              Anticipated d/c is to: Inpatient psychiatric unit              Anticipated d/c date is: 1 day              Patient currently is not medically stable to d/c.  Advancing diet today.  Patient tolerating regular diet without nausea or vomiting she will be medically ready for discharge to inpatient behavioral health unit on 02/28/2020       Consultants:   Psychiatry  Procedures:   None  Antimicrobials:   None   Subjective: Seen and examined.  Flattened affect but offers no complaints  Objective: Vitals:   02/27/20 1215 02/27/20 1230 02/27/20 1300 02/27/20 1331  BP: 105/63 101/65 108/74 102/78  Pulse: 77 69 (!) 57 65  Resp: (!) 23 14 17 20   Temp:      TempSrc:      SpO2: 99% 99% 98% 97%  Weight:      Height:        Intake/Output Summary (Last 24 hours) at 02/27/2020 1453 Last data filed at 02/27/2020 0530 Gross per 24 hour  Intake 1049.14 ml  Output --  Net 1049.14 ml   Filed Weights   02/25/20 1857  Weight: 72.6 kg    Examination:  General exam: Flattened affect.  Appears depressed.  No acute distress Respiratory system: Clear to auscultation. Respiratory effort normal. Cardiovascular system: S1 & S2 heard, RRR. No JVD, murmurs, rubs, gallops or clicks. No pedal edema. Gastrointestinal system: Abdomen is nondistended, soft and nontender. No organomegaly or masses felt. Normal bowel sounds heard. Central nervous system: Alert and oriented. No focal  neurological deficits. Extremities: Symmetric 5 x 5 power. Skin: No rashes, lesions or ulcers Psychiatry: Judgement and insight appear impaired. Mood & affect flattened.     Data Reviewed: I have personally reviewed following labs and imaging studies  CBC: Recent Labs  Lab 02/25/20 1851  WBC 5.5  NEUTROABS 3.3  HGB 12.8  HCT 38.5  MCV 89.7  PLT 223   Basic Metabolic Panel: Recent Labs  Lab 02/25/20 1851 02/27/20 0009  NA 141 139  K 3.4* 2.9*  CL 103 106  CO2 25 26  GLUCOSE 142* 98  BUN 14 6  CREATININE 0.72 0.69  CALCIUM 9.4 8.4*  MG  --  1.8   GFR: Estimated Creatinine Clearance: 108.2 mL/min (by C-G formula based on SCr of 0.69 mg/dL). Liver Function Tests: Recent Labs  Lab 02/25/20 1851 02/27/20 0009  AST 33 19  ALT 30 23  ALKPHOS 81 54  BILITOT 0.6 0.7  PROT 7.9 6.4*  ALBUMIN 4.3 3.3*   No results for input(s): LIPASE, AMYLASE in the last 168 hours. No results for input(s): AMMONIA in the last 168 hours. Coagulation Profile: Recent Labs  Lab 02/26/20 1108 02/27/20 0009  INR 1.0 1.3*   1.3*   Cardiac Enzymes: No results for input(s): CKTOTAL, CKMB, CKMBINDEX, TROPONINI in the last 168 hours. BNP (last 3 results) No results for input(s): PROBNP in the last 8760 hours. HbA1C: No results for input(s): HGBA1C in the last 72 hours. CBG: No results for input(s): GLUCAP in the last 168 hours. Lipid Profile: No results for input(s): CHOL, HDL, LDLCALC, TRIG, CHOLHDL, LDLDIRECT in the last 72 hours. Thyroid Function Tests: No results for input(s): TSH, T4TOTAL, FREET4, T3FREE, THYROIDAB in the last 72 hours. Anemia Panel: Recent Labs    02/26/20 0013  FERRITIN 84   Sepsis Labs: Recent Labs  Lab 02/25/20 1902 02/26/20 0000 02/26/20 0013  PROCALCITON  --   --  <0.10  LATICACIDVEN 2.6* 1.4  --     Recent Results (from the past 240 hour(s))  Resp Panel by RT-PCR (Flu A&B, Covid) Nasopharyngeal Swab     Status: Abnormal   Collection Time:  02/25/20 10:17 PM   Specimen: Nasopharyngeal Swab; Nasopharyngeal(NP) swabs in vial transport medium  Result Value Ref Range Status   SARS Coronavirus 2 by RT PCR POSITIVE (A) NEGATIVE Final    Comment: CRITICAL RESULT CALLED TO, READ BACK BY AND VERIFIED WITH: CASSIE STILTS AT 2354 02/25/20. MF (NOTE) SARS-CoV-2 target nucleic acids are DETECTED.  The SARS-CoV-2 RNA is generally detectable in upper respiratory specimens during the acute phase of infection. Positive results are indicative of the presence of the identified virus, but do not rule out bacterial infection or co-infection with other pathogens not detected by the test. Clinical correlation with patient history and other diagnostic information is necessary to determine patient infection status. The expected result is Negative.  Fact Sheet for Patients: BloggerCourse.com  Fact Sheet for Healthcare Providers: SeriousBroker.it  This test is not yet approved or cleared by the [place] FDA and  has been authorized for detection and/or diagnosis of SARS-CoV-2 by FDA under an Emergency Use Authorization (  EUA).  This EUA will remain in effect (meaning this te st can be used) for the duration of  the COVID-19 declaration under Section 564(b)(1) of the Act, 21 U.S.C. section 360bbb-3(b)(1), unless the authorization is terminated or revoked sooner.     Influenza A by PCR NEGATIVE NEGATIVE Final   Influenza B by PCR NEGATIVE NEGATIVE Final    Comment: (NOTE) The Xpert Xpress SARS-CoV-2/FLU/RSV plus assay is intended as an aid in the diagnosis of influenza from Nasopharyngeal swab specimens and should not be used as a sole basis for treatment. Nasal washings and aspirates are unacceptable for Xpert Xpress SARS-CoV-2/FLU/RSV testing.  Fact Sheet for Patients: BloggerCourse.com  Fact Sheet for Healthcare  Providers: SeriousBroker.it  This test is not yet approved or cleared by the [place] FDA and has been authorized for detection and/or diagnosis of SARS-CoV-2 by FDA under an Emergency Use Authorization (EUA). This EUA will remain in effect (meaning this test can be used) for the duration of the COVID-19 declaration under Section 564(b)(1) of the Act, 21 U.S.C. section 360bbb-3(b)(1), unless the authorization is terminated or revoked.  Performed at [facility], [address] [place], [place] [address]          Radiology Studies: [facility] Chest [place] 1 View  Result Date: 02/26/2020 CLINICAL DATA:  COVID positive. EXAM: PORTABLE CHEST 1 VIEW COMPARISON:  None. FINDINGS: The heart size and mediastinal contours are within normal limits. Both lungs are clear. The visualized skeletal structures are unremarkable. IMPRESSION: No active disease. Electronically Signed   By: Katherine Mantle M.D.   On: 02/26/2020 02:24        Scheduled Meds:  citalopram  20 mg Oral Daily   enoxaparin (LOVENOX) injection  40 mg Subcutaneous Q24H   estradiol  1 mg Oral Daily   Continuous Infusions:  sodium chloride 100 mL/hr at 02/27/20 1131     LOS: 1 day    Time spent: 25 minutes    Tresa Moore, MD [facility] Pager [phone]  If 7PM-7AM, please contact night-coverage 02/27/2020, 2:53 PM

## 2020-02-27 NOTE — ED Notes (Signed)
Patient resting comfortably in bed at this time. No needs expressed at this time. Will continue to monitor. 

## 2020-02-27 NOTE — ED Notes (Signed)
Dinner tray given to patient at this time.

## 2020-02-27 NOTE — ED Notes (Signed)
Attempted to give report to nurse, Dianne Dun. Floor stated that they did not have sitter for patient. Floor made aware that we were doing 15 minute checks at this time.

## 2020-02-27 NOTE — ED Notes (Signed)
Patient given lunch tray.

## 2020-02-27 NOTE — Progress Notes (Signed)
MEDICATION RELATED CONSULT NOTE   Pharmacy Consult for Acetadote  Indication: Tylenol OD  Not on File  Patient Measurements: Height: 5\' 10"  (177.8 cm) Weight: 72.6 kg (160 lb) IBW/kg (Calculated) : 68.5  Vital Signs: BP: 100/73 (01/16 2245) Pulse Rate: 51 (01/16 2245) Intake/Output from previous day: 01/16 0701 - 01/17 0700 In: 849.1 [I.V.:849.1] Out: -  Intake/Output from this shift: Total I/O In: 849.1 [I.V.:849.1] Out: -   Labs: Recent Labs    02/25/20 1851 02/27/20 0009  WBC 5.5  --   HGB 12.8  --   HCT 38.5  --   PLT 223  --   CREATININE 0.72 0.69  ALBUMIN 4.3 3.3*  PROT 7.9 6.4*  AST 33 19  ALT 30 23  ALKPHOS 81 54  BILITOT 0.6 0.7   Estimated Creatinine Clearance: 108.2 mL/min (by C-G formula based on SCr of 0.69 mg/dL).  Acetaminophen (Tylenol), Serum 10 - 30 ug/mL 273High Panic   Comment: CRITICAL RESULT CALLED TO, READ BACK BY AND VERIFIED WITH  REED RENO 02/25/20 2247 [facility]  (NOTE)  Therapeutic concentrations vary significantly. A range of 10-30 ug/mL  may be an effective concentration for many patients. However, some  are best treated at concentrations outside of this range.  Acetaminophen concentrations >150 ug/mL at 4 hours after ingestion  and >50 ug/mL at 12 hours after ingestion are often associated with  toxic reactions.   Performed at [facility], [address] [place],  [place]    Medications:  (Not in a hospital admission)  Assessment: Pt here with PD under IVC. Per PD pt was witnessed taking "a few handfuls of Tylenol."  -Covid +  Plan:  Acetadote 150mg /Kg loading dose then 15mg /kg/hr infusion Continue to monitor labs and clinical status  1/15 @ 1851: AST 33/ALT 30 on admission. APAP level=220 INR 1.0 at 1108 1/16 Salicylate level <7.0 (negative) Pregnancy negative Urine tox screen negative. Acetadote IV started 1/16 at 0015 per [facility]  1/16 @ 0526 APAP level= 82. Patient had N/V.  Will order CMP  (Bmet, ALT,AST), INR for 2200  (22 hours post Acetadote initiation per protocol). INR this am is pending draw  1/17 @ 0009 AST 19, ALT 23 (wnl). Continue to monitor.  [facility], Ivon Roedel A 02/27/2020,1:18 AM

## 2020-02-27 NOTE — Consult Note (Signed)
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 made a serious suicide attempt with serious intention.  Continues to present as very depressed withdrawn flat and endorsing suicidal ideation.  Patient is  COVID-positive and will need to quarantine during that time ruling out immediate psychiatric hospitalization.  She has been admitted to the medical floor and will be followed in consultation.  Continue current citalopram while reassessing medication  needs.  Disposition: Recommend psychiatric Inpatient admission when medically cleared. Supportive therapy provided about ongoing stressors.  Mordecai Rasmussen, MD 02/27/2020 3:30 PM

## 2020-02-28 ENCOUNTER — Other Ambulatory Visit: Payer: Self-pay

## 2020-02-28 LAB — COMPREHENSIVE METABOLIC PANEL
ALT: 24 U/L (ref 0–44)
AST: 19 U/L (ref 15–41)
Albumin: 3.6 g/dL (ref 3.5–5.0)
Alkaline Phosphatase: 66 U/L (ref 38–126)
Anion gap: 9 (ref 5–15)
BUN: 8 mg/dL (ref 6–20)
CO2: 27 mmol/L (ref 22–32)
Calcium: 9 mg/dL (ref 8.9–10.3)
Chloride: 104 mmol/L (ref 98–111)
Creatinine, Ser: 0.55 mg/dL (ref 0.44–1.00)
GFR, Estimated: >60 mL/min (ref >60)
Glucose, Bld: 85 mg/dL (ref 70–99)
Potassium: 3.5 mmol/L (ref 3.5–5.1)
Sodium: 140 mmol/L (ref 135–145)
Total Bilirubin: 0.9 mg/dL (ref 0.3–1.2)
Total Protein: 7 g/dL (ref 6.5–8.1)

## 2020-02-28 LAB — PROTIME-INR
INR: 1.1 (ref 0.8–1.2)
Prothrombin Time: 13.4 seconds (ref 11.4–15.2)

## 2020-02-28 LAB — C-REACTIVE PROTEIN: CRP: 0.5 mg/dL (ref <1.0)

## 2020-02-28 MED ORDER — SUMATRIPTAN SUCCINATE 50 MG PO TABS
25.0000 mg | ORAL_TABLET | ORAL | Status: DC | PRN
  Filled 2020-02-28: qty 1

## 2020-02-28 NOTE — Progress Notes (Signed)
PROGRESS NOTE    Rebecca Medina  BWL:[id number] DOB: 1985-09-02 DOA: 02/25/2020 PCP: Pcp, No    Brief Narrative:  35 year old female with history significant for depression with a history of suicide attempts via overdose and self-injurious behavior who was brought to the emergency room after taking intentionally large amount of acetaminophen and a presumed suicide attempt after the ending of a relationship.  She is fine incidentally to be COVID-positive.  No pneumonia on chest x-ray.  No fevers, no oxygen requirement.  On my evaluation patient is resting comfortably in bed.  She is in no visible distress.  She is not especially communicative.  She has been started on IV N-acetylcysteine.  APAP level negative.  Poison control contacted with instructions to discontinue N-acetylcysteine.  Patient remains hemodynamically stable.  Seen by psychiatry who recommends inpatient psychiatric admission after medical clearance.  Patient is medically stable for discharge to behavioral health unit.  Communicated with Dr. Toni Amend.  Per Dr. Toni Amend current behavioral unit rules are that they cannot accept unvaccinated patients for 7days post positive test or vaccinated patients for 5 days.  Patient is unvaccinated, positive test was on 02/25/2020 so if she remains asymptomatic she should be able to discharge to inpatient psychiatric health on 03/03/2020.   Assessment & Plan:   Principal Problem:   Severe recurrent major depression without psychotic features (HCC) Active Problems:   Tylenol overdose, intentional self-harm, initial encounter (HCC)   Depression with suicidal ideation   COVID-19 virus infection  APAP overdose Suicide attempt Patient has an extensive psychiatric history with history of suicide attempts in the past Reported that she took an unknown amount of Tylenol witnessed by friends Seen by psychiatry, recommendations appreciated Poison control contacted and patient started on  N-acetylcysteine NAC infusion now discontinued with negative APAP level LFTs within normal limits Plan: N-acetylcysteine discontinued DC intravenous fluids Continue regular diet As needed antiemetics Medically ready for disposition.  Discharge to behavioral health unit affected by COVID-positive status.  Anticipated date of discharge to behavioral health 03/03/2020  COVID-19 infection Is asymptomatic No pneumonia on chest x-ray, no fevers no oxygen requirement Plan: Airborne and contact precautions No indication for remdesivir or steroids at this time Vitals per unit protocol, oxygen if needed  Major depressive disorder Continue Celexa Suicide precautions Psych consult   DVT prophylaxis: SQ Lovenox Code Status: Full Family Communication: None.  No family listed in chart. disposition Plan: Status is: Inpatient  Remains inpatient appropriate because:Unsafe d/c plan   Dispo: The patient is from: Home              Anticipated d/c is to: Behavioral health unit              Anticipated d/c date is: 03/03/2020              Patient currently is medically stable to d/c.  Patient medically stable for discharge.  Anticipated disposition to behavioral health unit.  Discharge barriers include COVID-positive status.  Per Dr. Toni Amend patient needs 7 days quarantine postpositive test which would mean she can discharge on 03/03/2020  Consultants:   Psychiatry  Procedures:   None  Antimicrobials:   None   Subjective: Seen and examined.  Flattened affect.  Poor eye contact.  Endorsing headache  Objective: Vitals:   02/27/20 2141 02/28/20 0535 02/28/20 0734 02/28/20 1232  BP: 119/76 104/67 103/62 110/64  Pulse: (!) 56 75 60 71  Resp: 18 17 17 20   Temp: 98.7 F (37.1 C) 98.7 F (37.1 C)  98.6 F (37 C)   TempSrc: Oral Oral Oral   SpO2: 97% 99% 97% 96%  Weight:      Height:        Intake/Output Summary (Last 24 hours) at 02/28/2020 1300 Last data filed at 02/27/2020  2300 Gross per 24 hour  Intake 120 ml  Output --  Net 120 ml   Filed Weights   02/25/20 1857  Weight: 72.6 kg    Examination:  General exam: Flattened affect.  Appears depressed.  No acute distress Respiratory system: Clear to auscultation. Respiratory effort normal. Cardiovascular system: S1 & S2 heard, RRR. No JVD, murmurs, rubs, gallops or clicks. No pedal edema. Gastrointestinal system: Abdomen is nondistended, soft and nontender. No organomegaly or masses felt. Normal bowel sounds heard. Central nervous system: Alert and oriented. No focal neurological deficits. Extremities: Symmetric 5 x 5 power. Skin: No rashes, lesions or ulcers Psychiatry: Judgement and insight appear impaired. Mood & affect flattened.     Data Reviewed: I have personally reviewed following labs and imaging studies  CBC: Recent Labs  Lab 02/25/20 1851  WBC 5.5  NEUTROABS 3.3  HGB 12.8  HCT 38.5  MCV 89.7  PLT 223   Basic Metabolic Panel: Recent Labs  Lab 02/25/20 1851 02/27/20 0009 02/28/20 0551  NA 141 139 140  K 3.4* 2.9* 3.5  CL 103 106 104  CO2 25 26 27   GLUCOSE 142* 98 85  BUN 14 6 8   CREATININE 0.72 0.69 0.55  CALCIUM 9.4 8.4* 9.0  MG  --  1.8  --    GFR: Estimated Creatinine Clearance: 108.2 mL/min (by C-G formula based on SCr of 0.55 mg/dL). Liver Function Tests: Recent Labs  Lab 02/25/20 1851 02/27/20 0009 02/28/20 0551  AST 33 19 19  ALT 30 23 24   ALKPHOS 81 54 66  BILITOT 0.6 0.7 0.9  PROT 7.9 6.4* 7.0  ALBUMIN 4.3 3.3* 3.6   No results for input(s): LIPASE, AMYLASE in the last 168 hours. No results for input(s): AMMONIA in the last 168 hours. Coagulation Profile: Recent Labs  Lab 02/26/20 1108 02/27/20 0009 02/28/20 0551  INR 1.0 1.3*   1.3* 1.1   Cardiac Enzymes: No results for input(s): CKTOTAL, CKMB, CKMBINDEX, TROPONINI in the last 168 hours. BNP (last 3 results) No results for input(s): PROBNP in the last 8760 hours. HbA1C: No results for  input(s): HGBA1C in the last 72 hours. CBG: No results for input(s): GLUCAP in the last 168 hours. Lipid Profile: No results for input(s): CHOL, HDL, LDLCALC, TRIG, CHOLHDL, LDLDIRECT in the last 72 hours. Thyroid Function Tests: No results for input(s): TSH, T4TOTAL, FREET4, T3FREE, THYROIDAB in the last 72 hours. Anemia Panel: Recent Labs    02/26/20 0013  FERRITIN 84   Sepsis Labs: Recent Labs  Lab 02/25/20 1902 02/26/20 0000 02/26/20 0013  PROCALCITON  --   --  <0.10  LATICACIDVEN 2.6* 1.4  --     Recent Results (from the past 240 hour(s))  Resp Panel by RT-PCR (Flu A&B, Covid) Nasopharyngeal Swab     Status: Abnormal   Collection Time: 02/25/20 10:17 PM   Specimen: Nasopharyngeal Swab; Nasopharyngeal(NP) swabs in vial transport medium  Result Value Ref Range Status   SARS Coronavirus 2 by RT PCR POSITIVE (A) NEGATIVE Final    Comment: CRITICAL RESULT CALLED TO, READ BACK BY AND VERIFIED WITH: CASSIE STILTS AT 2354 02/25/20. MF (NOTE) SARS-CoV-2 target nucleic acids are DETECTED.  The SARS-CoV-2 RNA is generally detectable in upper  respiratory specimens during the acute phase of infection. Positive results are indicative of the presence of the identified virus, but do not rule out bacterial infection or co-infection with other pathogens not detected by the test. Clinical correlation with patient history and other diagnostic information is necessary to determine patient infection status. The expected result is Negative.  Fact Sheet for Patients: BloggerCourse.com  Fact Sheet for Healthcare Providers: SeriousBroker.it  This test is not yet approved or cleared by the [place] FDA and  has been authorized for detection and/or diagnosis of SARS-CoV-2 by FDA under an Emergency Use Authorization (EUA).  This EUA will remain in effect (meaning this te st can be used) for the duration of  the COVID-19 declaration  under Section 564(b)(1) of the Act, 21 U.S.C. section 360bbb-3(b)(1), unless the authorization is terminated or revoked sooner.     Influenza A by PCR NEGATIVE NEGATIVE Final   Influenza B by PCR NEGATIVE NEGATIVE Final    Comment: (NOTE) The Xpert Xpress SARS-CoV-2/FLU/RSV plus assay is intended as an aid in the diagnosis of influenza from Nasopharyngeal swab specimens and should not be used as a sole basis for treatment. Nasal washings and aspirates are unacceptable for Xpert Xpress SARS-CoV-2/FLU/RSV testing.  Fact Sheet for Patients: BloggerCourse.com  Fact Sheet for Healthcare Providers: SeriousBroker.it  This test is not yet approved or cleared by the [place] FDA and has been authorized for detection and/or diagnosis of SARS-CoV-2 by FDA under an Emergency Use Authorization (EUA). This EUA will remain in effect (meaning this test can be used) for the duration of the COVID-19 declaration under Section 564(b)(1) of the Act, 21 U.S.C. section 360bbb-3(b)(1), unless the authorization is terminated or revoked.  Performed at [facility], [address] [place], [place] [address]          Radiology Studies: No results found.      Scheduled Meds:  citalopram  20 mg Oral Daily   enoxaparin (LOVENOX) injection  40 mg Subcutaneous Q24H   estradiol  1 mg Oral Daily   Continuous Infusions:    LOS: 2 days    Time spent: 25 minutes    Tresa Moore, MD [facility] Pager [phone]  If 7PM-7AM, please contact night-coverage 02/28/2020, 1:00 PM
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
## 2020-02-28 NOTE — Plan of Care (Signed)
  Problem: Education: Goal: Knowledge of risk factors and measures for prevention of condition will improve Outcome: Progressing   

## 2020-02-29 DIAGNOSIS — T1491XA Suicide attempt, initial encounter: Secondary | ICD-10-CM

## 2020-02-29 DIAGNOSIS — F331 Major depressive disorder, recurrent, moderate: Secondary | ICD-10-CM

## 2020-02-29 LAB — COMPREHENSIVE METABOLIC PANEL
ALT: 44 U/L (ref 0–44)
AST: 44 U/L — ABNORMAL HIGH (ref 15–41)
Albumin: 3.9 g/dL (ref 3.5–5.0)
Alkaline Phosphatase: 71 U/L (ref 38–126)
Anion gap: 8 (ref 5–15)
BUN: 10 mg/dL (ref 6–20)
CO2: 30 mmol/L (ref 22–32)
Calcium: 9.4 mg/dL (ref 8.9–10.3)
Chloride: 103 mmol/L (ref 98–111)
Creatinine, Ser: 0.59 mg/dL (ref 0.44–1.00)
GFR, Estimated: >60 mL/min (ref >60)
Glucose, Bld: 93 mg/dL (ref 70–99)
Potassium: 4 mmol/L (ref 3.5–5.1)
Sodium: 141 mmol/L (ref 135–145)
Total Bilirubin: 0.9 mg/dL (ref 0.3–1.2)
Total Protein: 7.4 g/dL (ref 6.5–8.1)

## 2020-02-29 LAB — PROTIME-INR
INR: 1.1 (ref 0.8–1.2)
Prothrombin Time: 13.3 seconds (ref 11.4–15.2)

## 2020-02-29 LAB — C-REACTIVE PROTEIN: CRP: 0.5 mg/dL (ref <1.0)
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
## 2020-02-29 NOTE — Discharge Instructions (Signed)
Isolate for 14 days from diagnosis

## 2020-02-29 NOTE — Progress Notes (Signed)
IVs removed before discharge. Went over discharge instructions and appointment with patient. All questions answered and patient stated that she understood. Patient went home POV.

## 2020-05-02 LAB — HM PAP SMEAR

## 2020-07-25 ENCOUNTER — Other Ambulatory Visit: Payer: Self-pay

## 2020-07-25 ENCOUNTER — Ambulatory Visit: Payer: Managed Care, Other (non HMO) | Admitting: Family Medicine

## 2020-10-10 ENCOUNTER — Other Ambulatory Visit: Payer: Self-pay

## 2020-10-10 ENCOUNTER — Ambulatory Visit: Payer: Managed Care, Other (non HMO) | Admitting: Nurse Practitioner

## 2020-10-10 ENCOUNTER — Other Ambulatory Visit: Payer: Self-pay | Admitting: Nurse Practitioner

## 2020-10-10 ENCOUNTER — Encounter: Payer: Self-pay | Admitting: Nurse Practitioner

## 2020-10-10 ENCOUNTER — Telehealth: Payer: Self-pay

## 2020-10-10 VITALS — BP 117/73 | HR 78 | Temp 98.2°F | Ht 70.0 in | Wt 184.0 lb

## 2020-10-10 DIAGNOSIS — Z139 Encounter for screening, unspecified: Secondary | ICD-10-CM | POA: Diagnosis not present

## 2020-10-10 DIAGNOSIS — F419 Anxiety disorder, unspecified: Secondary | ICD-10-CM | POA: Insufficient documentation

## 2020-10-10 DIAGNOSIS — Z7689 Persons encountering health services in other specified circumstances: Secondary | ICD-10-CM | POA: Diagnosis not present

## 2020-10-10 DIAGNOSIS — G43111 Migraine with aura, intractable, with status migrainosus: Secondary | ICD-10-CM | POA: Insufficient documentation

## 2020-10-10 DIAGNOSIS — Z9109 Other allergy status, other than to drugs and biological substances: Secondary | ICD-10-CM | POA: Insufficient documentation

## 2020-10-10 DIAGNOSIS — J4520 Mild intermittent asthma, uncomplicated: Secondary | ICD-10-CM

## 2020-10-10 DIAGNOSIS — F332 Major depressive disorder, recurrent severe without psychotic features: Secondary | ICD-10-CM

## 2020-10-10 DIAGNOSIS — J45909 Unspecified asthma, uncomplicated: Secondary | ICD-10-CM | POA: Insufficient documentation

## 2020-10-10 MED ORDER — RIZATRIPTAN BENZOATE 5 MG PO TBDP: 5.0000 mg | ORAL_TABLET | ORAL | 0 refills | Status: DC | PRN

## 2020-10-10 MED ORDER — SUMATRIPTAN SUCCINATE 50 MG PO TABS: 50.0000 mg | ORAL_TABLET | ORAL | 0 refills | Status: DC | PRN

## 2020-10-10 MED ORDER — ALBUTEROL SULFATE HFA 108 (90 BASE) MCG/ACT IN AERS
2.0000 | INHALATION_SPRAY | Freq: Four times a day (QID) | RESPIRATORY_TRACT | 0 refills | Status: DC | PRN
Start: 2020-10-10 — End: 2020-12-10

## 2020-10-10 NOTE — Assessment & Plan Note (Signed)
-  obtain records 

## 2020-10-10 NOTE — Telephone Encounter (Signed)
Sent in maxalt

## 2020-10-10 NOTE — Assessment & Plan Note (Addendum)
-  GAD-7 = 21 -she is on max dose celexa -referral to therapy/psych -may consider [facility] at her next appt

## 2020-10-10 NOTE — Telephone Encounter (Signed)
Patient called to advise it is maxalt she needs for migraines ph# [phone]

## 2020-10-10 NOTE — Patient Instructions (Signed)
Please have fasting labs drawn 2-3 days prior to your appointment so we can discuss the results during your office visit.  

## 2020-10-10 NOTE — Assessment & Plan Note (Signed)
-  Rx. imitrex -she is requesting neuro consult; will honor that request

## 2020-10-10 NOTE — Assessment & Plan Note (Signed)
-  Rx. Albuterol -referral to asthma/allergy
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
## 2020-10-10 NOTE — Assessment & Plan Note (Signed)
-  PHQ-9 = 21 -referral to psych and therapy

## 2020-10-10 NOTE — Assessment & Plan Note (Signed)
-  referral to allergy -she would like testing; consider desensitization if available

## 2020-10-11 HISTORY — PX: DIAGNOSTIC LAPAROSCOPY: SUR761

## 2020-10-16 ENCOUNTER — Other Ambulatory Visit: Payer: Self-pay | Admitting: Nurse Practitioner

## 2020-10-16 DIAGNOSIS — G43111 Migraine with aura, intractable, with status migrainosus: Secondary | ICD-10-CM

## 2020-10-22 DIAGNOSIS — T50902A Poisoning by unspecified drugs, medicaments and biological substances, intentional self-harm, initial encounter: Secondary | ICD-10-CM | POA: Insufficient documentation

## 2020-10-30 ENCOUNTER — Ambulatory Visit (INDEPENDENT_AMBULATORY_CARE_PROVIDER_SITE_OTHER): Payer: Managed Care, Other (non HMO) | Admitting: Licensed Clinical Social Worker

## 2020-10-30 ENCOUNTER — Other Ambulatory Visit: Payer: Self-pay

## 2020-10-30 DIAGNOSIS — F419 Anxiety disorder, unspecified: Secondary | ICD-10-CM

## 2020-11-09 ENCOUNTER — Ambulatory Visit: Payer: Managed Care, Other (non HMO) | Admitting: Allergy & Immunology

## 2020-11-09 NOTE — BH Specialist Note (Signed)
[facility] Initial TeleMedicine Clinical Assessment  MRN: [id number] NAME: Rebecca Medina Date: 11/09/20  Start time: 9a End time: 920a Total time: 20  Types of Service: Telephone visit Referring Provider: Pearline Cables, NP Reason for Visit today: begin VBH  Patient/Family location: home [place] Provider location: home office All persons participating in visit: yes  I connected with patient and/or family via Telephone or Video Enabled Telemedicine Application  (Video is Caregility application) and verified that I am speaking with the correct person using two identifiers.   Discussed confidentiality: Yes   I discussed the limitations of telemedicine and the availability of in person appointments.  Discussed there is a possibility of technology failure and discussed alternative modes of communication if that failure occurs.  I discussed that engaging in this telemedicine visit, they consent to the provision of behavioral healthcare and the services will be billed under their insurance.  Patient and/or legal guardian expressed understanding and consented to Telemedicine visit: Yes   Treatment History Patient recently received Inpatient Treatment: Yes  Patient currently being seen by therapist/psychiatrist: Yes  Patient currently receiving the following services:   Past Psychiatric History/Diagnosis/Hospitalization(s): Anxiety: Yes Bipolar Disorder: No Depression: Yes Mania: No Psychosis: No Schizophrenia: No Personality Disorder: Yes Hospitalization for psychiatric illness: No History of Electroconvulsive Shock Therapy: No Prior Suicide Attempts: No  Decreased need for sleep: No  Euphoria: No  Self Injurious behaviors: No  Family History of mental illness: No  Family History of substance abuse: No  Substance Abuse: No  DUI: No  Insomnia: No   History of violence: No  Physical, sexual or emotional abuse: No  Prior outpatient mental health therapy: No    Clinical Assessment  PHQ-9 & GAD-7 Assessments: This is an evidence based assessment tool for depression and anxiety symptoms in adolescents and adults.  Score cut-off points for each section are as follows: 5-9: Mild, 10-14: Moderate, 15+: Severe    Social Functioning Social maturity: WNL Social judgement: WNL  Stress Current stressors: feeling overwhelmed Familial stressors: denies Sleep: poor Appetite: fair Coping ability: overwhelmed Patient taking medications as prescribed:  yes  Current medications:  Outpatient Encounter Medications as of 10/30/2020  Medication Sig   albuterol (VENTOLIN HFA) 108 (90 Base) MCG/ACT inhaler Inhale 2 puffs into the lungs every 6 (six) hours as needed for wheezing or shortness of breath.   citalopram (CELEXA) 40 MG tablet Take 40 mg by mouth daily.   estradiol (ESTRACE) 1 MG tablet Take 1.5 mg by mouth daily.   QUEtiapine (SEROQUEL) 50 MG tablet Take 50 mg by mouth at bedtime.   rizatriptan (MAXALT-MLT) 5 MG disintegrating tablet TAKE 1 TABLET AT ONSET OF MIGRAINE HEADACHE MAY REPEAT ONCE IN 2 HOURS (MAX OF 2 TABLETS/24 HOURS)   No facility-administered encounter medications on file as of 10/30/2020.     Self-harm and/or Suicidal Behaviors Risk Assessment Self-harm risk factors: no Patient endorses recent self injurious thoughts and/or behaviors: No  Suicide ideations: No plan to harm self or others   Danger to Others Risk Assessment Danger to others risk factors: no Patient endorses recent thoughts of harming others: No    Substance Use Assessment Patient recently consumed alcohol: No  Patient recently used drugs: No  Patient is concerned about dependence or abuse of substances: No    Goals, Interventions and Follow-up Plan Goals: Increase healthy adjustment to current life circumstances Interventions: Mindfulness or Relaxation Training Follow-up Plan:  VBH sessions biweekly  Summary of Clinical Assessment Summary: Rebecca Medina  is  a 35 yr old referred by her PCP.  Rebecca Medina has symptoms of her anxiety: excessive worry, on edge, anxiousness.  She has had MH treatment at several agencies with no success. Therapist met with Patient in an initial therapy session to assess current mood and to build rapport. Therapist engaged Patient in discussion about her life and what is going well for her. Therapist provided support for Patient as she shared details about her life, her current stressors, mood, coping skills, her past. Therapist prompted Patient to discuss her support system and ways that she manages her daily stress, anger, and frustrations.     Lubertha South, LCSW

## 2020-11-19 ENCOUNTER — Ambulatory Visit: Payer: Managed Care, Other (non HMO) | Admitting: Psychiatry

## 2020-11-28 ENCOUNTER — Ambulatory Visit: Payer: Managed Care, Other (non HMO) | Admitting: Allergy & Immunology

## 2020-11-28 ENCOUNTER — Other Ambulatory Visit: Payer: Self-pay

## 2020-11-28 ENCOUNTER — Encounter: Payer: Self-pay | Admitting: Allergy & Immunology

## 2020-11-28 VITALS — BP 130/80 | HR 88 | Temp 98.4°F | Resp 20 | Ht 70.0 in | Wt 192.4 lb

## 2020-11-28 DIAGNOSIS — R0602 Shortness of breath: Secondary | ICD-10-CM | POA: Diagnosis not present

## 2020-11-28 DIAGNOSIS — J302 Other seasonal allergic rhinitis: Secondary | ICD-10-CM | POA: Diagnosis not present

## 2020-11-28 DIAGNOSIS — J45990 Exercise induced bronchospasm: Secondary | ICD-10-CM | POA: Diagnosis not present

## 2020-11-28 DIAGNOSIS — J3089 Other allergic rhinitis: Secondary | ICD-10-CM | POA: Diagnosis not present

## 2020-11-28 DIAGNOSIS — L301 Dyshidrosis [pompholyx]: Secondary | ICD-10-CM

## 2020-11-28 NOTE — Progress Notes (Signed)
NEW PATIENT  Date of Service/Encounter:  11/28/20  Consult requested by: Noreene Larsson, NP   Assessment:   SOB (shortness of breath) - with normal spirometry  Seasonal and perennial allergic rhinitis  Dyshidrotic eczema  Exercise induced bronchospasm  Plan/Recommendations:    1. SOB (shortness of breath) - Lung testing had normal value the first time and you did not really feel that the albuterol treatment improved your symptoms.  - I am not convinced that this has anything to do with asthma, so do not worry about using albuterol. - Panic attacks have a lot of overlap with asthma, so I can understand your confusion about your symptoms. - However, nothing we have right now points towards asthma, so when this happens again, try breathing exercises.  - We can certainly do more evaluation of this shortness of breath in the future if needed.  - However, this could certainly be her exercise-induced bronchospasm, she could try using 2 puffs of albuterol 10 to 15 minutes before physical activity to see if this helps prevent these reactions.  2. Seasonal and perennial allergic rhinitis - Testing today showed: grasses, ragweed, weeds, dust mites, cat, and dog - Copy of test results provided.  - Avoidance measures provided to help decrease your exposure to allergens.  - Let's start you on some allergy medications to see if it helps at all.  - Start taking: Zyrtec (cetirizine) 51m tablet once daily (can use twice daily if needed) - You can use an extra dose of the antihistamine, if needed, for breakthrough symptoms.  - Consider nasal saline rinses 1-2 times daily to remove allergens from the nasal cavities as well as help with mucous clearance (this is especially helpful to do before the nasal sprays are given) - Consider allergy shots as a means of long-term control. - Allergy shots "re-train" and "reset" the immune system to ignore environmental allergens and decrease the resulting  immune response to those allergens (sneezing, itchy watery eyes, runny nose, nasal congestion, etc).    - Allergy shots improve symptoms in 75-85% of patients.  - We can discuss more at the next appointment if the medications are not working for you.  3. Dyshidrotic eczema - You seem to have a good handle on these symptoms. - We can start a topical steroid in the future if needed.  4. Return in about 2 months (around 01/28/2021).     This note in its entirety was forwarded to the Provider who requested this consultation.  Subjective:   Rebecca Minusis a 35y.o. female presenting today for evaluation of  Chief Complaint  Patient presents with   Asthma   Environmental Allergies    Rebecca Minushas a history of the following: Patient Active Problem List   Diagnosis Date Noted   Encounter to establish care 10/10/2020   Migraine with aura, with intractable migraine, so stated, with status migrainosus 10/10/2020   Asthma 10/10/2020   Anxiety 10/10/2020   Environmental allergies 10/10/2020   Moderate episode of recurrent major depressive disorder ([facility]    Severe recurrent major depression without psychotic features ([facility] 02/27/2020   Tylenol overdose, intentional self-harm, initial encounter ([facility] 02/25/2020   Depression with suicidal ideation 02/25/2020   COVID-19 virus infection 02/25/2020    History obtained from: chart review and patient.  Rebecca Minuswas referred by GNoreene Larsson NP.     JAnderson Maltais a 35y.o. female presenting for an evaluation of asthma and allergies .   Asthma/Respiratory  Symptom History: She has had asthma for her entire life. She was told that it happened because she was out of shape. She has issues with "black out" spells and dizziness and inability to breathe when she does physical activity.  She has issues with even the slightest amount of physical activity. She has never done a breathing test. She has an albuterol inhaler but she has  not used it since she was not want to confuse the picture by taking something that might not be indicated. She does not wake up coughing at night and she reports that dry air can cause some coughing. She has not been hospitalized for her breathing problems. She has not been to the ED for her breathing.   **Patient was apparently angry when she was discharged and referring to [place] and everything we did as a certain expletive. I called patient after the visit to clarify. She tells me that she does not accept that this is all related to panic attacks. She knows that she has lung problems. I offered to have her use albuterol two puffs before physical activity to see if we can just prevent these reactions. She was open to that and will give [place] an update in a couple of weeks.**  Allergic Rhinitis Symptom History: She does have some rhinorrhea and sneezing. She is allergic to cats but she loves them. She is allergic to dogs but she loves those too. She is allergic to the trees. Spring can be bad for her but there is no particular time that is worse than another. She has migraines from her allergies. She moved here from [place] when she was 9 and her migraines started at that time. Rain can cause some issues.  Food Allergy Symptom History: She "believes" that she is allergic to coffee because it gives her migraines.  Otherwise she tolerates all of the major food allergens without adverse event.  Skin Symptom History: She has dyshidrotic eczema on her hands. Right now it is ifairly well controlled and it is stress induced. It was bad in the past and she gets topical steroids for it.   Otherwise, there is no history of other atopic diseases, including drug allergies, stinging insect allergies, eczema, urticaria, or contact dermatitis. There is no significant infectious history. Vaccinations are up to date.    Past Medical History: Patient Active Problem List   Diagnosis Date Noted   Encounter to establish care  10/10/2020   Migraine with aura, with intractable migraine, so stated, with status migrainosus 10/10/2020   Asthma 10/10/2020   Anxiety 10/10/2020   Environmental allergies 10/10/2020   Moderate episode of recurrent major depressive disorder (HCC)    Severe recurrent major depression without psychotic features ([facility]) 02/27/2020   Tylenol overdose, intentional self-harm, initial encounter ([place]) 02/25/2020   Depression with suicidal ideation 02/25/2020   COVID-19 virus infection 02/25/2020    Medication List:  Allergies as of 11/28/2020       Reactions   Cetirizine-pseudoephedrine Er Hives   Dimenhydrinate Hives   Tape Hives        Medication List        Accurate as of November 28, 2020 11:59 PM. If you have any questions, ask your nurse or doctor.          STOP taking these medications    citalopram 40 MG tablet Commonly known as: CELEXA Stopped by: Valentina Shaggy, MD   rizatriptan 5 MG disintegrating tablet Commonly known as: MAXALT-MLT Stopped by: Gwenith Daily  Ernst Bowler, MD       TAKE these medications    albuterol 108 (90 Base) MCG/ACT inhaler Commonly known as: VENTOLIN HFA Inhale 2 puffs into the lungs every 6 (six) hours as needed for wheezing or shortness of breath.   estradiol 1 MG tablet Commonly known as: ESTRACE Take 1.5 mg by mouth daily.   ipratropium 0.06 % nasal spray Commonly known as: ATROVENT Place 1 spray into both nostrils as directed.   QUEtiapine 50 MG tablet Commonly known as: SEROQUEL Take 50 mg by mouth at bedtime.        Birth History: non-contributory  Developmental History: Anderson Malta has met all milestones on time. She has renon-contributory  Past Surgical History: Past Surgical History:  Procedure Laterality Date   laproscopic surgery for endometriosis     reports 6 procedure   TOTAL ABDOMINAL HYSTERECTOMY W/ BILATERAL SALPINGOOPHORECTOMY       Family History: Family History  Problem Relation Age of Onset    Cancer Mother    Brain cancer Mother    Breast cancer Sister      Social History: Anderson Malta lives at home by herself. She lives in a house that is around 34 years old. There is fake wood throughout the home with linoleum and area rugs in the main living spaces. There is gas heating and central cooling. There is a corn snake in the home. There are no dust mite coverings on the bedding. There is no tobacco exposure in the home. 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 She has been there for six months. She does not work around fumes, chemicals, or dust. She does not use a HEPA filter. She does not live near an interstate or industrial area.   She has a complicated life since childhood. She has been in the foster care system since the age of 4 years. She has been with the same family until she aged out of the system. She moved from [place] to [place] around the age of 43. She said that she grew up with 13 brothers and sisters (many were other foster children and others were biologic children), but refers to this as heavenly chaos. It seems like it was a good fit for her. She does keep up with some of her siblings, but like all families it is complicated.    Review of Systems  Constitutional: Negative.  Negative for chills, fever, malaise/fatigue and weight loss.  HENT:  Positive for congestion. Negative for ear discharge, ear pain and sinus pain.   Eyes:  Negative for pain, discharge and redness.  Respiratory:  Positive for cough and shortness of breath. Negative for sputum production and wheezing.   Cardiovascular: Negative.  Negative for chest pain and palpitations.  Gastrointestinal:  Negative for abdominal pain, constipation, diarrhea, heartburn, nausea and vomiting.  Skin: Negative.  Negative for itching and rash.  Neurological:  Negative for dizziness and headaches.  Endo/Heme/Allergies:  Positive for environmental allergies. Does not  bruise/bleed easily.      Objective:   Blood pressure 130/80, pulse 88, temperature 98.4 F (36.9 C), resp. rate 20, height '5\' 10"'  (1.778 m), weight 192 lb 6.4 oz (87.3 kg), SpO2 98 %. Body mass index is 27.61 kg/m.   Physical Exam:   Physical Exam Vitals reviewed.  Constitutional:      Appearance: She is well-developed.  HENT:     Head: Normocephalic and atraumatic.     Right Ear: Tympanic membrane, ear canal  and external ear normal. No drainage, swelling or tenderness. Tympanic membrane is not injected, scarred, erythematous, retracted or bulging.     Left Ear: Tympanic membrane, ear canal and external ear normal. No drainage, swelling or tenderness. Tympanic membrane is not injected, scarred, erythematous, retracted or bulging.     Nose: No nasal deformity, septal deviation, mucosal edema or rhinorrhea.     Right Turbinates: Enlarged, swollen and pale.     Left Turbinates: Enlarged, swollen and pale.     Right Sinus: No maxillary sinus tenderness or frontal sinus tenderness.     Left Sinus: No maxillary sinus tenderness or frontal sinus tenderness.     Mouth/Throat:     Mouth: Mucous membranes are not pale and not dry.     Pharynx: Uvula midline.  Eyes:     General:        Right eye: No discharge.        Left eye: No discharge.     Conjunctiva/sclera: Conjunctivae normal.     Right eye: Right conjunctiva is not injected. No chemosis.    Left eye: Left conjunctiva is not injected. No chemosis.    Pupils: Pupils are equal, round, and reactive to light.  Cardiovascular:     Rate and Rhythm: Normal rate and regular rhythm.     Heart sounds: Normal heart sounds.  Pulmonary:     Effort: Pulmonary effort is normal. No tachypnea, accessory muscle usage or respiratory distress.     Breath sounds: Normal breath sounds. No wheezing, rhonchi or rales.     Comments: Moving air well in all lung fields. No increased work of breathing noted.  Chest:     Chest wall: No tenderness.   Abdominal:     Tenderness: There is no abdominal tenderness. There is no guarding or rebound.  Lymphadenopathy:     Head:     Right side of head: No submandibular, tonsillar or occipital adenopathy.     Left side of head: No submandibular, tonsillar or occipital adenopathy.     Cervical: No cervical adenopathy.  Skin:    Coloration: Skin is not pale.     Findings: No abrasion, erythema, petechiae or rash. Rash is not papular, urticarial or vesicular.  Neurological:     Mental Status: She is alert.  Psychiatric:        Behavior: Behavior is cooperative.     Diagnostic studies:   Spirometry: results normal (FEV1: 2.85/75%, FVC: 3.44/75%, FEV1/FVC: 83%).    Spirometry consistent with normal pattern.   Allergy Studies:     Airborne Adult Perc - 11/28/20 1057     Time Antigen Placed 1015    Allergen Manufacturer Lavella Hammock    Location Back    Number of Test 59    1. Control-Buffer 50% Glycerol Negative    2. Control-Histamine 1 mg/ml 2+    3. Albumin saline Negative    4. Olney Negative    5. [place] Negative    6. Johnson Negative    7. Nash Blue Negative    8. Meadow Fescue Negative    9. Perennial Rye Negative    10. Sweet Vernal Negative    11. Timothy Negative    12. Cocklebur Negative    13. Burweed Marshelder Negative    14. Ragweed, short 3+    15. Ragweed, Giant 2+    16. Plantain,  English Negative    17. Lamb's Quarters Negative    18. Sheep Sorrell Negative    19. Rough  Pigweed Negative    20. Marsh Elder, Rough 2+    21. Mugwort, Common Negative    22. Ash mix Negative    23. Birch mix Negative    24. Beech American Negative    25. Box, Elder Negative    26. Cedar, red Negative    27. Cottonwood, [place] Negative    28. Elm mix Negative    29. Hickory Negative    30. Maple mix Negative    31. Oak, [place] mix Negative    32. Pecan Pollen Negative    33. Pine mix Negative    34. Sycamore Eastern Negative    35. Fillmore, Black Pollen Negative     36. Alternaria alternata Negative    37. Cladosporium Herbarum Negative    38. Aspergillus mix Negative    39. Penicillium mix Negative    40. Bipolaris sorokiniana (Helminthosporium) Negative    41. Drechslera spicifera (Curvularia) Negative    42. Mucor plumbeus Negative    43. Fusarium moniliforme Negative    44. Aureobasidium pullulans (pullulara) Negative    45. Rhizopus oryzae Negative    46. Botrytis cinera Negative    47. Epicoccum nigrum Negative    48. Phoma betae Negative    49. Candida Albicans Negative    50. Trichophyton mentagrophytes Negative    51. Mite, D Farinae  5,000 AU/ml Negative    52. Mite, D Pteronyssinus  5,000 AU/ml Negative    53. Cat Hair 10,000 BAU/ml Negative    54.  Dog Epithelia Negative    55. Mixed Feathers Negative    56. Horse Epithelia Negative    57. Cockroach, German Negative    58. Mouse Negative    59. Tobacco Leaf Negative             Intradermal - 11/28/20 1057     Time Antigen Placed 1045    Allergen Manufacturer Greer    Location Arm    Number of Test 13    Control Negative    [place] Negative    Johnson 3+    7 Grass Negative    Ragweed mix Negative    Mold 1 Negative    Mold 2 Negative    Mold 3 Negative    Mold 4 Negative    Cat 3+    Dog 2+    Cockroach Negative    Mite mix 3+             Allergy testing results were read and interpreted by myself, documented by clinical staff.         Salvatore Marvel, MD [facility] [place] [facility]

## 2020-11-28 NOTE — Patient Instructions (Addendum)
1. SOB (shortness of breath) - Lung testing had normal value the first time and you did not really feel that the albuterol treatment improved your symptoms.  - I am not convinced that this has anything to do with asthma, so do not worry about using albuterol. - Panic attacks have a lot of overlap with asthma, so I can understand your confusion about your symptoms. - However, nothing we have right now points towards asthma, so when this happens again, try breathing exercises.  - We can certainly do more evaluation of this shortness of breath in the future if needed.  - However, this could certainly be her exercise-induced bronchospasm, she could try using 2 puffs of albuterol 10 to 15 minutes before physical activity to see if this helps prevent these reactions.  2. Seasonal and perennial allergic rhinitis - Testing today showed: grasses, ragweed, weeds, dust mites, cat, and dog - Copy of test results provided.  - Avoidance measures provided to help decrease your exposure to allergens.  - Let's start you on some allergy medications to see if it helps at all.  - Start taking: Zyrtec (cetirizine) 10mg  tablet once daily (can use twice daily if needed) - You can use an extra dose of the antihistamine, if needed, for breakthrough symptoms.  - Consider nasal saline rinses 1-2 times daily to remove allergens from the nasal cavities as well as help with mucous clearance (this is especially helpful to do before the nasal sprays are given) - Consider allergy shots as a means of long-term control. - Allergy shots "re-train" and "reset" the immune system to ignore environmental allergens and decrease the resulting immune response to those allergens (sneezing, itchy watery eyes, runny nose, nasal congestion, etc).    - Allergy shots improve symptoms in 75-85% of patients.  - We can discuss more at the next appointment if the medications are not working for you.  3. Dyshidrotic eczema - You seem to have a  good handle on these symptoms. - We can start a topical steroid in the future if needed.  4. Return in about 2 months (around 01/28/2021).    Please inform 01/30/2021 of any Emergency Department visits, hospitalizations, or changes in symptoms. Call us before going to the ED for breathing or allergy symptoms since we might be able to fit you in for a sick visit. Feel free to contact us anytime with any questions, problems, or concerns.  It was a pleasure to meet you today!  Websites that have reliable patient information: 1. American Academy of Asthma, Allergy, and Immunology: www.aaaai.org 2. Food Allergy Research and Education (FARE): foodallergy.org 3. Mothers of Asthmatics: http://www.asthmacommunitynetwork.org 4. American College of Allergy, Asthma, and Immunology: www.acaai.org   COVID-19 Vaccine Information can be found at: [place] For questions related to vaccine distribution or appointments, please email [email or web address] or call [phone].   We realize that you might be concerned about having an allergic reaction to the COVID19 vaccines. To help with that concern, WE ARE OFFERING THE COVID19 VACCINES IN OUR OFFICE! Ask the front desk for dates!     "Like" [phone] on Facebook and Instagram for our latest updates!      A healthy democracy works best when [place] participate! Make sure you are registered to vote! If you have moved or changed any of your contact information, you will need to get this updated before voting!  In some cases, you MAY be able to register to vote online: [employer]    Reducing Pollen Exposure  The American Academy of Allergy, Asthma and Immunology suggests the following steps to reduce your exposure to pollen during allergy seasons.    Do not hang sheets or clothing out to dry; pollen may collect on these items. Do not mow lawns or spend  time around freshly cut grass; mowing stirs up pollen. Keep windows closed at night.  Keep car windows closed while driving. Minimize morning activities outdoors, a time when pollen counts are usually at their highest. Stay indoors as much as possible when pollen counts or humidity is high and on windy days when pollen tends to remain in the air longer. Use air conditioning when possible.  Many air conditioners have filters that trap the pollen spores. Use a HEPA room air filter to remove pollen form the indoor air you breathe.  .[email or web address]  Control of Dust Mite Allergen    Dust mites play a major role in allergic asthma and rhinitis.  They occur in environments with high humidity wherever human skin is found.  Dust mites absorb humidity from the atmosphere (ie, they do not drink) and feed on organic matter (including shed human and animal skin).  Dust mites are a microscopic type of insect that you cannot see with the naked eye.  High levels of dust mites have been detected from mattresses, pillows, carpets, upholstered furniture, bed covers, clothes, soft toys and any woven material.  The principal allergen of the dust mite is found in its feces.  A gram of dust may contain 1,000 mites and 250,000 fecal particles.  Mite antigen is easily measured in the air during house cleaning activities.  Dust mites do not bite and do not cause harm to humans, other than by triggering allergies/asthma.    Ways to decrease your exposure to dust mites in your home:  Encase mattresses, box springs and pillows with a mite-impermeable barrier or cover   Wash sheets, blankets and drapes weekly in hot water (130 F) with detergent and dry them in a dryer on the hot setting.  Have the room cleaned frequently with a vacuum cleaner and a damp dust-mop.  For carpeting or rugs, vacuuming with a vacuum cleaner equipped with a high-efficiency particulate air (HEPA) filter.  The dust mite allergic individual should not  be in a room which is being cleaned and should wait 1 hour after cleaning before going into the room. Do not sleep on upholstered furniture (eg, couches).   If possible removing carpeting, upholstered furniture and drapery from the home is ideal.  Horizontal blinds should be eliminated in the rooms where the person spends the most time (bedroom, study, television room).  Washable vinyl, roller-type shades are optimal. Remove all non-washable stuffed toys from the bedroom.  Wash stuffed toys weekly like sheets and blankets above.   Reduce indoor humidity to less than 50%.  Inexpensive humidity monitors can be purchased at most hardware stores.  Do not use a humidifier as can make the problem worse and are not recommended.  Allergy Shots   Allergies are the result of a chain reaction that starts in the immune system. Your immune system controls how your body defends itself. For instance, if you have an allergy to pollen, your immune system identifies pollen as an invader or allergen. Your immune system overreacts by producing antibodies called Immunoglobulin E (IgE). These antibodies travel to cells that release chemicals, causing an allergic reaction.  The concept behind allergy immunotherapy, whether it is received in the form of shots or tablets, is  that the immune system can be desensitized to specific allergens that trigger allergy symptoms. Although it requires time and patience, the payback can be long-term relief.  How Do Allergy Shots Work?  Allergy shots work much like a vaccine. Your body responds to injected amounts of a particular allergen given in increasing doses, eventually developing a resistance and tolerance to it. Allergy shots can lead to decreased, minimal or no allergy symptoms.  There generally are two phases: build-up and maintenance. Build-up often ranges from three to six months and involves receiving injections with increasing amounts of the allergens. The shots are  typically given once or twice a week, though more rapid build-up schedules are sometimes used.  The maintenance phase begins when the most effective dose is reached. This dose is different for each person, depending on how allergic you are and your response to the build-up injections. Once the maintenance dose is reached, there are longer periods between injections, typically two to four weeks.  Occasionally doctors give cortisone-type shots that can temporarily reduce allergy symptoms. These types of shots are different and should not be confused with allergy immunotherapy shots.  Who Can Be Treated with Allergy Shots?  Allergy shots may be a good treatment approach for people with allergic rhinitis (hay fever), allergic asthma, conjunctivitis (eye allergy) or stinging insect allergy.   Before deciding to begin allergy shots, you should consider:   The length of allergy season and the severity of your symptoms  Whether medications and/or changes to your environment can control your symptoms  Your desire to avoid long-term medication use  Time: allergy immunotherapy requires a major time commitment  Cost: may vary depending on your insurance coverage  Allergy shots for children age 69 and older are effective and often well tolerated. They might prevent the onset of new allergen sensitivities or the progression to asthma.  Allergy shots are not started on patients who are pregnant but can be continued on patients who become pregnant while receiving them. In some patients with other medical conditions or who take certain common medications, allergy shots may be of risk. It is important to mention other medications you talk to your allergist.   When Will I Feel Better?  Some may experience decreased allergy symptoms during the build-up phase. For others, it may take as long as 12 months on the maintenance dose. If there is no improvement after a year of maintenance, your allergist will discuss  other treatment options with you.  If you aren't responding to allergy shots, it may be because there is not enough dose of the allergen in your vaccine or there are missing allergens that were not identified during your allergy testing. Other reasons could be that there are high levels of the allergen in your environment or major exposure to non-allergic triggers like tobacco smoke.  What Is the Length of Treatment?  Once the maintenance dose is reached, allergy shots are generally continued for three to five years. The decision to stop should be discussed with your allergist at that time. Some people may experience a permanent reduction of allergy symptoms. Others may relapse and a longer course of allergy shots can be considered.  What Are the Possible Reactions?  The two types of adverse reactions that can occur with allergy shots are local and systemic. Common local reactions include very mild redness and swelling at the injection site, which can happen immediately or several hours after. A systemic reaction, which is less common, affects the entire body  or a particular body system. They are usually mild and typically respond quickly to medications. Signs include increased allergy symptoms such as sneezing, a stuffy nose or hives.  Rarely, a serious systemic reaction called anaphylaxis can develop. Symptoms include swelling in the throat, wheezing, a feeling of tightness in the chest, nausea or dizziness. Most serious systemic reactions develop within 30 minutes of allergy shots. This is why it is strongly recommended you wait in your doctor's office for 30 minutes after your injections. Your allergist is trained to watch for reactions, and his or her staff is trained and equipped with the proper medications to identify and treat them.  Who Should Administer Allergy Shots?  The preferred location for receiving shots is your prescribing allergist's office. Injections can sometimes be given at  another facility where the physician and staff are trained to recognize and treat reactions, and have received instructions by your prescribing allergist.

## 2020-11-29 ENCOUNTER — Encounter: Payer: Self-pay | Admitting: Allergy & Immunology

## 2020-11-30 ENCOUNTER — Encounter: Payer: Managed Care, Other (non HMO) | Admitting: Nurse Practitioner

## 2020-12-05 ENCOUNTER — Ambulatory Visit: Payer: Managed Care, Other (non HMO) | Admitting: Allergy & Immunology

## 2020-12-10 ENCOUNTER — Encounter: Payer: Self-pay | Admitting: Psychiatry

## 2020-12-10 ENCOUNTER — Telehealth: Payer: Self-pay | Admitting: *Deleted

## 2020-12-10 ENCOUNTER — Ambulatory Visit: Payer: Managed Care, Other (non HMO) | Admitting: Psychiatry

## 2020-12-10 VITALS — BP 107/74 | HR 77 | Ht 70.0 in | Wt 195.0 lb

## 2020-12-10 DIAGNOSIS — G43709 Chronic migraine without aura, not intractable, without status migrainosus: Secondary | ICD-10-CM | POA: Diagnosis not present

## 2020-12-10 MED ORDER — TOPIRAMATE 25 MG PO TABS: ORAL_TABLET | ORAL | 3 refills | Status: DC

## 2020-12-10 MED ORDER — UBRELVY 100 MG PO TABS: 100.0000 mg | ORAL_TABLET | ORAL | 2 refills | Status: DC | PRN

## 2020-12-10 NOTE — Telephone Encounter (Signed)
[place] [facility], key [facility], [phone].  Prior Therapies                                 Imitrex - lack of efficacy Maxalt 10 mg - works sometimes Topamax Citalopram 40 mg daily [facility] is reviewing your PA request and will respond within 24 hours for Medicaid or up to 72 hours for non-Medicaid plans.
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
## 2020-12-10 NOTE — Patient Instructions (Addendum)
Start Topamax for migraine prevention. Take 1 pill at bedtime for one week, then take 2 pills at bedtime for one week, then take 3 pills at bedtime for one week, then take 4 pills at bedtime. Let me know one you reach 4 pills and I can send a prescription for a 100 mg tablet. Start [place] as needed for migraines. Take 100 mg at earliest sign of migraine. May repeat once in 2 hours.  Let me know if you want to start physical therapy for the neck and I will send in a referral

## 2020-12-11 NOTE — Telephone Encounter (Signed)
Per [employer] Ubrelvy approved. Case [id number] Coverage Start Date:12/10/2020; Coverage End 12/10/2021.  Patient sent my chart stating it was denied. Replied and advised her it appears to be approved. Will inform her about savings card in the event she needs it.

## 2020-12-24 ENCOUNTER — Emergency Department (HOSPITAL_COMMUNITY)
Admission: EM | Admit: 2020-12-24 | Discharge: 2020-12-24 | Disposition: A | Discharge status: Home / Self Care | Payer: Managed Care, Other (non HMO) | Attending: Emergency Medicine | Admitting: Emergency Medicine

## 2020-12-24 ENCOUNTER — Emergency Department (HOSPITAL_COMMUNITY): Payer: Managed Care, Other (non HMO)

## 2020-12-24 ENCOUNTER — Encounter (HOSPITAL_COMMUNITY): Payer: Self-pay | Admitting: *Deleted

## 2020-12-24 DIAGNOSIS — K625 Hemorrhage of anus and rectum: Secondary | ICD-10-CM | POA: Diagnosis not present

## 2020-12-24 DIAGNOSIS — Z8616 Personal history of COVID-19: Secondary | ICD-10-CM | POA: Insufficient documentation

## 2020-12-24 DIAGNOSIS — N898 Other specified noninflammatory disorders of vagina: Secondary | ICD-10-CM | POA: Insufficient documentation

## 2020-12-24 DIAGNOSIS — R1031 Right lower quadrant pain: Secondary | ICD-10-CM | POA: Diagnosis present

## 2020-12-24 DIAGNOSIS — J45909 Unspecified asthma, uncomplicated: Secondary | ICD-10-CM | POA: Insufficient documentation

## 2020-12-24 LAB — WET PREP, GENITAL
Clue Cells Wet Prep HPF POC: NONE SEEN
Sperm: NONE SEEN
Trich, Wet Prep: NONE SEEN
Yeast Wet Prep HPF POC: NONE SEEN

## 2020-12-24 LAB — URINALYSIS, ROUTINE W REFLEX MICROSCOPIC
Bilirubin Urine: NEGATIVE
Glucose, UA: NEGATIVE mg/dL
Hgb urine dipstick: NEGATIVE
Ketones, ur: NEGATIVE mg/dL
Leukocytes,Ua: NEGATIVE
Nitrite: NEGATIVE
Protein, ur: NEGATIVE mg/dL
Specific Gravity, Urine: 1.016 (ref 1.005–1.030)
pH: 7 (ref 5.0–8.0)

## 2020-12-24 LAB — COMPREHENSIVE METABOLIC PANEL
ALT: 16 U/L (ref 0–44)
AST: 17 U/L (ref 15–41)
Albumin: 3.9 g/dL (ref 3.5–5.0)
Alkaline Phosphatase: 74 U/L (ref 38–126)
Anion gap: 6 (ref 5–15)
BUN: 16 mg/dL (ref 6–20)
CO2: 25 mmol/L (ref 22–32)
Calcium: 9.1 mg/dL (ref 8.9–10.3)
Chloride: 108 mmol/L (ref 98–111)
Creatinine, Ser: 0.67 mg/dL (ref 0.44–1.00)
GFR, Estimated: >60 mL/min (ref >60)
Glucose, Bld: 99 mg/dL (ref 70–99)
Potassium: 3.9 mmol/L (ref 3.5–5.1)
Sodium: 139 mmol/L (ref 135–145)
Total Bilirubin: 0.5 mg/dL (ref 0.3–1.2)
Total Protein: 7.8 g/dL (ref 6.5–8.1)

## 2020-12-24 LAB — CBC WITH DIFFERENTIAL/PLATELET
Abs Immature Granulocytes: 0.02 10*3/uL (ref 0.00–0.07)
Basophils Absolute: 0.1 10*3/uL (ref 0.0–0.1)
Basophils Relative: 1 %
Eosinophils Absolute: 0.1 10*3/uL (ref 0.0–0.5)
Eosinophils Relative: 2 %
HCT: 40.1 % (ref 36.0–46.0)
Hemoglobin: 13.3 g/dL (ref 12.0–15.0)
Immature Granulocytes: 0 %
Lymphocytes Relative: 31 %
Lymphs Abs: 1.9 10*3/uL (ref 0.7–4.0)
MCH: 29.7 pg (ref 26.0–34.0)
MCHC: 33.2 g/dL (ref 30.0–36.0)
MCV: 89.5 fL (ref 80.0–100.0)
Monocytes Absolute: 0.6 10*3/uL (ref 0.1–1.0)
Monocytes Relative: 9 %
Neutro Abs: 3.6 10*3/uL (ref 1.7–7.7)
Neutrophils Relative %: 57 %
Platelets: 337 10*3/uL (ref 150–400)
RBC: 4.48 MIL/uL (ref 3.87–5.11)
RDW: 12.6 % (ref 11.5–15.5)
WBC: 6.3 10*3/uL (ref 4.0–10.5)
nRBC: 0 % (ref 0.0–0.2)

## 2020-12-24 LAB — POC OCCULT BLOOD, ED: Fecal Occult Bld: POSITIVE — AB

## 2020-12-24 LAB — LIPASE, BLOOD: Lipase: 34 U/L (ref 11–51)

## 2020-12-24 MED ORDER — IOHEXOL 300 MG/ML  SOLN
100.0000 mL | Freq: Once | INTRAMUSCULAR | Status: AC | PRN
  Administered 2020-12-24: 100 mL via INTRAVENOUS

## 2020-12-24 MED ORDER — DICYCLOMINE HCL 10 MG PO CAPS
10.0000 mg | ORAL_CAPSULE | Freq: Once | ORAL | Status: AC
  Administered 2020-12-24: 10 mg via ORAL
  Filled 2020-12-24: qty 1

## 2020-12-24 MED ORDER — DICYCLOMINE HCL 20 MG PO TABS: 20.0000 mg | ORAL_TABLET | Freq: Two times a day (BID) | ORAL | 0 refills | Status: DC
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
## 2020-12-24 NOTE — ED Notes (Signed)
Went over d/c papers with pt. Patient removed IV and wires. Pt dressed self and was alert oriented and ambulatory to lobby with visitor. V/s not recollected due to the same. Rebecca Medina

## 2020-12-24 NOTE — ED Triage Notes (Signed)
Rectal bleeding onset yesterday

## 2020-12-24 NOTE — Discharge Instructions (Addendum)
You came to the emergency department today to be evaluated for your rectal bleeding and abdominal pain.  Your lab work was reassuring.  The CT scan of your abdomen and pelvis showed no acute abnormalities.  Your pain improved after receiving Bentyl.  I have given you prescription of this medication to take in the outpatient setting.  I have given you information to follow-up with gastroenterology.  Please call to schedule follow-up appointment as soon as possible.  You have testing for gonorrhea, chlamydia, syphilis, and HIV pending at this time.  If you are positive you will be called about this test result.  If you are negative you will not hear anything.  You can find results on your [facility] MyChart.  If positive please follow-up with [facility] or [facility] department, your PCP, urgent care, or return to the emergency department.  Get help right away if you have: New or increased rectal bleeding. Black or dark red stools. Vomit with blood or something that looks like coffee grounds. A fainting episode. Severe pain in your rectum.

## 2020-12-25 ENCOUNTER — Encounter: Payer: Self-pay | Admitting: Internal Medicine

## 2020-12-25 ENCOUNTER — Telehealth: Payer: Self-pay | Admitting: Internal Medicine

## 2020-12-25 LAB — URINE CULTURE: Culture: NO GROWTH

## 2020-12-25 LAB — HIV ANTIBODY (ROUTINE TESTING W REFLEX): HIV Screen 4th Generation wRfx: NONREACTIVE

## 2020-12-25 LAB — RPR: RPR Ser Ql: NONREACTIVE

## 2020-12-25 NOTE — Telephone Encounter (Signed)
ER REFERRAL  

## 2020-12-27 ENCOUNTER — Telehealth: Payer: Self-pay | Admitting: Allergy & Immunology

## 2020-12-27 NOTE — Telephone Encounter (Signed)
Well I am glad that it works.  This certainly points towards exercise-induced bronchospasm.  I would recommend that she start a combined ICS/LABA.  Please send in Symbicort 160 mcg 2 puffs once in the morning.  This should cover her for any physical activity she is doing over the course of the day.  Please have her do this for a couple of weeks and give [place] an update.  Malachi Bonds, MD [facility]

## 2020-12-27 NOTE — Telephone Encounter (Signed)
Rebecca Medina called in and states that when she uses her rescue inhaler before exercise.. it is only lasting about 30 minutes.  She states that her breathing gets very heavy and it is hard to breathe and she has to do it again.  She would like to know what else can she do?  Please advise.

## 2020-12-27 NOTE — Telephone Encounter (Signed)
Attempted to call patient to discuss but received a busy tone. Will need to attempt to call again and confirm pharmacy to send Symbicort inhaler to.

## 2020-12-28 NOTE — Telephone Encounter (Signed)
Attempted to call the patient. There was no answer and the voicemail was full. Will attempt to call again.

## 2021-01-01 NOTE — Telephone Encounter (Signed)
Called and left a voicemail asking for patient to return call to discuss.  °

## 2021-01-02 MED ORDER — BUDESONIDE-FORMOTEROL FUMARATE 160-4.5 MCG/ACT IN AERO: 2.0000 | INHALATION_SPRAY | Freq: Two times a day (BID) | RESPIRATORY_TRACT | 5 refills | Status: DC

## 2021-01-02 NOTE — Addendum Note (Signed)
Addended by: Berna Bue on: 01/02/2021 11:28 AM   Modules accepted: Orders

## 2021-01-02 NOTE — Telephone Encounter (Signed)
Patient would like Symbicort called in to   [facility] [address] [place], [place], [place] [address] [phone]

## 2021-01-02 NOTE — Telephone Encounter (Signed)
Sent in symbicort 160 2 puffs bid to the drug store

## 2021-01-07 ENCOUNTER — Encounter: Payer: Self-pay | Admitting: Internal Medicine

## 2021-01-07 ENCOUNTER — Other Ambulatory Visit: Payer: Self-pay

## 2021-01-07 ENCOUNTER — Ambulatory Visit: Payer: Managed Care, Other (non HMO) | Admitting: Internal Medicine

## 2021-01-07 VITALS — BP 106/66 | HR 74 | Temp 98.2°F | Resp 16 | Ht 70.0 in | Wt 189.0 lb

## 2021-01-07 DIAGNOSIS — R739 Hyperglycemia, unspecified: Secondary | ICD-10-CM | POA: Insufficient documentation

## 2021-01-07 DIAGNOSIS — N3010 Interstitial cystitis (chronic) without hematuria: Secondary | ICD-10-CM

## 2021-01-07 DIAGNOSIS — Z23 Encounter for immunization: Secondary | ICD-10-CM | POA: Diagnosis not present

## 2021-01-07 DIAGNOSIS — Z0001 Encounter for general adult medical examination with abnormal findings: Secondary | ICD-10-CM

## 2021-01-07 DIAGNOSIS — Z1159 Encounter for screening for other viral diseases: Secondary | ICD-10-CM | POA: Insufficient documentation

## 2021-01-07 LAB — HEMOGLOBIN A1C: Hgb A1c MFr Bld: 5.6 % (ref 4.6–6.5)

## 2021-01-07 NOTE — Patient Instructions (Signed)
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
## 2021-01-08 LAB — HEPATITIS C ANTIBODY
Hepatitis C Ab: NONREACTIVE
SIGNAL TO CUT-OFF: 0.05 (ref <1.00)

## 2021-01-09 ENCOUNTER — Encounter: Payer: Self-pay | Admitting: Internal Medicine

## 2021-01-21 ENCOUNTER — Ambulatory Visit: Payer: Managed Care, Other (non HMO) | Admitting: Urology

## 2021-01-21 ENCOUNTER — Ambulatory Visit (HOSPITAL_COMMUNITY)
Admission: RE | Admit: 2021-01-21 | Discharge: 2021-01-21 | Disposition: A | Discharge status: Home / Self Care | Payer: Managed Care, Other (non HMO) | Source: Ambulatory Visit | Attending: Urology | Admitting: Urology

## 2021-01-21 ENCOUNTER — Other Ambulatory Visit: Payer: Self-pay

## 2021-01-21 ENCOUNTER — Encounter: Payer: Self-pay | Admitting: Urology

## 2021-01-21 VITALS — BP 115/73 | HR 90

## 2021-01-21 DIAGNOSIS — R102 Pelvic and perineal pain: Secondary | ICD-10-CM

## 2021-01-21 DIAGNOSIS — Z9682 Presence of neurostimulator: Secondary | ICD-10-CM | POA: Diagnosis not present

## 2021-01-21 DIAGNOSIS — R310 Gross hematuria: Secondary | ICD-10-CM | POA: Diagnosis not present

## 2021-01-21 LAB — BLADDER SCAN AMB NON-IMAGING
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
## 2021-01-21 NOTE — Progress Notes (Signed)
blUrological Symptom Review  Patient is experiencing the following symptoms: Urinary tract infection Painful intercourse   Review of Systems  Gastrointestinal (upper)  : Negative for upper GI symptoms  Gastrointestinal (lower) : Negative for lower GI symptoms  Constitutional : Night Sweats  Skin: Negative for skin symptoms  Eyes: Negative for eye symptoms  Ear/Nose/Throat : Negative for Ear/Nose/Throat symptoms  Hematologic/Lymphatic: Negative for Hematologic/Lymphatic symptoms  Cardiovascular : Negative for cardiovascular symptoms  Respiratory : Shortness of breath  Endocrine: Negative for endocrine symptoms  Musculoskeletal: Negative for musculoskeletal symptoms  Neurological: Headaches  Psychologic: Depression Anxiety

## 2021-01-22 LAB — URINALYSIS, ROUTINE W REFLEX MICROSCOPIC
Bilirubin, UA: NEGATIVE
Glucose, UA: NEGATIVE
Ketones, UA: NEGATIVE
Leukocytes,UA: NEGATIVE
Nitrite, UA: NEGATIVE
Protein,UA: NEGATIVE
RBC, UA: NEGATIVE
Specific Gravity, UA: 1.025 (ref 1.005–1.030)
Urobilinogen, Ur: 0.2 mg/dL (ref 0.2–1.0)
pH, UA: 6 (ref 5.0–7.5)

## 2021-02-06 ENCOUNTER — Ambulatory Visit: Payer: Managed Care, Other (non HMO) | Admitting: Internal Medicine

## 2021-02-06 ENCOUNTER — Other Ambulatory Visit: Payer: Self-pay | Admitting: Urology

## 2021-02-06 ENCOUNTER — Encounter: Payer: Self-pay | Admitting: *Deleted

## 2021-02-06 ENCOUNTER — Encounter: Payer: Self-pay | Admitting: Internal Medicine

## 2021-02-06 ENCOUNTER — Other Ambulatory Visit: Payer: Self-pay

## 2021-02-06 VITALS — BP 111/71 | HR 79 | Temp 97.5°F | Ht 70.0 in | Wt 185.6 lb

## 2021-02-06 DIAGNOSIS — R1031 Right lower quadrant pain: Secondary | ICD-10-CM | POA: Diagnosis not present

## 2021-02-06 DIAGNOSIS — K921 Melena: Secondary | ICD-10-CM

## 2021-02-06 DIAGNOSIS — R102 Pelvic and perineal pain: Secondary | ICD-10-CM

## 2021-02-06 DIAGNOSIS — T83190A Other mechanical complication of urinary electronic stimulator device, initial encounter: Secondary | ICD-10-CM

## 2021-02-06 MED ORDER — CLENPIQ 10-3.5-12 MG-GM -GM/160ML PO SOLN: 1.0000 | Freq: Once | ORAL | 0 refills | Status: AC

## 2021-02-06 NOTE — Progress Notes (Signed)
Surgical Physician Order Form [facility] [place]  * Scheduling expectation : Next Available  *Length of Case: 60 minutes  *MD Preforming Case: Di Kindle, MD  *Assistant Needed: no  *[facility]ility Preference: [name]  *Clearance needed: no  *Anticoagulation Instructions: N/A  *Aspirin Instructions: N/A  -Admit type: OUTpatient  -Anesthesia: General  -Use Standing Orders:  none  *Diagnosis:  Non-functioning InterStim device  *Procedure:  Removal of InterStim     Additional orders: N/A  -Equipment: C-Arm -VTE Prophylaxis Standing Order SCDs       Other: Requesting InterStim rep to be present  -Standing Lab Orders Per Anesthesia    Lab other: None  -Standing Test orders EKG/Chest x-ray per Anesthesia       Test other:   - Medications:  Ancef 2gm IV  -Other orders:  Faythe Dingwall  *Post-op visit Date/Instructions:  MD Follow up 1 month
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
## 2021-02-06 NOTE — Addendum Note (Signed)
Addended by: Armstead Peaks on: 02/06/2021 03:04 PM   Modules accepted: Orders

## 2021-02-06 NOTE — Patient Instructions (Signed)
We will schedule you for colonoscopy to further evaluate your episode of abdominal pain and rectal bleeding.  In the meantime I recommend increasing fiber in your diet or adding OTC Benefiber/Metamucil. Be sure to drink at least 4 to 6 glasses of water daily.   Further recommendations to follow.  It was nice meeting you today.  I hope you have a happy new year.  Dr. Marletta Lor  At [facility] we value your feedback. You may receive a survey about your visit today. Please share your experience as we strive to create trusting relationships with our patients to provide genuine, compassionate, quality care.  We appreciate your understanding and patience as we review any laboratory studies, imaging, and other diagnostic tests that are ordered as we care for you. Our office policy is 5 business days for review of these results, and any emergent or urgent results are addressed in a timely manner for your best interest. If you do not hear from our office in 1 week, please contact us.   We also encourage the use of MyChart, which contains your medical information for your review as well. If you are not enrolled in this feature, an access code is on this after visit summary for your convenience. Thank you for allowing [place] to be involved in your care.  It was great to see you today!  I hope you have a great rest of your Winter!    Hennie Duos. Marletta Lor, D.O. Gastroenterology and Hepatology [facility]
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
## 2021-02-12 ENCOUNTER — Telehealth: Payer: Self-pay

## 2021-02-12 ENCOUNTER — Telehealth: Payer: Self-pay | Admitting: Internal Medicine

## 2021-02-12 NOTE — Telephone Encounter (Signed)
Sent to Kristen

## 2021-02-12 NOTE — Telephone Encounter (Signed)
Phoned the pt and vm not set up.

## 2021-02-12 NOTE — Telephone Encounter (Signed)
Pt called to let us know that she has started bleeding again since Saturday. She saw Dr Marletta Lor on 02/06/2021. Please call [phone]

## 2021-02-12 NOTE — Telephone Encounter (Signed)
Rebecca Medina, FYI: Returned the pt's call and was advised by her she had bleeding over the weekend. Saturday was in her stool and the commode, Sunday not much blood seen, yesterday and today none was seen. Pt states no abdomen pain, fatigue or feeling of being light headed. States she has a colonoscopy coming up ina few weeks. Just wanted to let us know regarding this. Pt was advised if it starts back to call us especially if abd pain is present. Pt states she feels fine today. Sent to you in lieu of Dr. Abbey Chatters being on [facility]

## 2021-02-12 NOTE — Telephone Encounter (Signed)
noted 

## 2021-02-12 NOTE — Telephone Encounter (Signed)
Noted.  I am glad her symptoms have resolved.  Lets update an H/H.  Please arrange.  Keep plans for upcoming colonoscopy.  If any profuse GI bleeding, lightheadedness, dizziness, feeling like she will pass out, she will need to proceed to the emergency room.

## 2021-02-13 ENCOUNTER — Other Ambulatory Visit: Payer: Self-pay

## 2021-02-13 DIAGNOSIS — K625 Hemorrhage of anus and rectum: Secondary | ICD-10-CM

## 2021-02-13 NOTE — Telephone Encounter (Signed)
Pt was made aware and verbalized understanding. Lab order was placed in chart and pt was made aware to have done.

## 2021-02-14 ENCOUNTER — Ambulatory Visit: Payer: Managed Care, Other (non HMO) | Admitting: Internal Medicine

## 2021-02-14 ENCOUNTER — Telehealth: Payer: Self-pay

## 2021-02-14 LAB — HEMOGLOBIN AND HEMATOCRIT, BLOOD
Hematocrit: 42.2 % (ref 34.0–46.6)
Hemoglobin: 13.8 g/dL (ref 11.1–15.9)

## 2021-02-14 NOTE — Progress Notes (Signed)
[facility] [place] [facility] Posting Form   Surgery Date/Time: Date: 04/05/2021  Surgeon: Dr. Di Kindle, MD  Surgery Location: Day Surgery  Inpt ( No  )   Outpt (Yes)   Obs ( No  )   Diagnosis: Pelvic Pain R10.2, Non-Functioning Interstim T83.190A  -CPT: 70263,78588  Surgery: Removal of Interstim   Stop Anticoagulations: N/A  Cardiac/Medical/Pulmonary Clearance needed: no  *Orders entered into EPIC  Date: 02/14/21   *Case booked in EPIC  Date: 02/14/21  *Notified pt of Surgery: Date: 02/13/2021  *Placed into Prior Authorization Work Angela Nevin Date: 02/14/21   Assistant/laser/rep:Yes, Judeth Cornfield Interstim Rep to be present.

## 2021-02-14 NOTE — Telephone Encounter (Signed)
I spoke with Rebecca Medina. We have discussed possible surgery dates and 04/05/2021 was agreed upon by all parties.  Patient given information about surgery date, what to expect pre-operatively and post operatively. We discussed that a pre-op nurse will be calling to set up the pre-op visit that will take place prior to surgery. Informed patient that our office will communicate any additional care to be provided after surgery. Patients questions or concerns were discussed during our call. Advised to call our office should there be any additional information, questions or concerns that arise. Patient verbalized understanding.

## 2021-02-22 ENCOUNTER — Other Ambulatory Visit: Payer: Self-pay | Admitting: Internal Medicine

## 2021-02-22 ENCOUNTER — Telehealth: Payer: Self-pay | Admitting: Internal Medicine

## 2021-02-22 DIAGNOSIS — F419 Anxiety disorder, unspecified: Secondary | ICD-10-CM

## 2021-02-22 NOTE — Telephone Encounter (Signed)
Patient requesting a referral to [facility] at [facility]  Encouraged patient to schedule a referral appt w/ provider, patient declined  Please advise

## 2021-02-27 ENCOUNTER — Ambulatory Visit (INDEPENDENT_AMBULATORY_CARE_PROVIDER_SITE_OTHER): Payer: 59 | Admitting: Clinical

## 2021-02-27 ENCOUNTER — Other Ambulatory Visit: Payer: Self-pay

## 2021-02-27 ENCOUNTER — Encounter (HOSPITAL_COMMUNITY): Payer: Self-pay

## 2021-02-27 DIAGNOSIS — F4310 Post-traumatic stress disorder, unspecified: Secondary | ICD-10-CM

## 2021-02-27 DIAGNOSIS — F331 Major depressive disorder, recurrent, moderate: Secondary | ICD-10-CM

## 2021-02-27 DIAGNOSIS — F419 Anxiety disorder, unspecified: Secondary | ICD-10-CM | POA: Diagnosis not present
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
## 2021-02-27 NOTE — Plan of Care (Signed)
Verbal consent  

## 2021-02-28 ENCOUNTER — Ambulatory Visit (HOSPITAL_COMMUNITY): Payer: Self-pay | Admitting: Clinical

## 2021-03-01 ENCOUNTER — Encounter (HOSPITAL_COMMUNITY)
Admission: RE | Disposition: A | Discharge status: Home / Self Care | Payer: Self-pay | Source: Home / Self Care | Attending: Internal Medicine

## 2021-03-01 ENCOUNTER — Encounter (HOSPITAL_COMMUNITY): Payer: Self-pay

## 2021-03-01 ENCOUNTER — Other Ambulatory Visit: Payer: Self-pay

## 2021-03-01 ENCOUNTER — Ambulatory Visit (HOSPITAL_COMMUNITY): Payer: Managed Care, Other (non HMO) | Admitting: Anesthesiology

## 2021-03-01 ENCOUNTER — Ambulatory Visit (HOSPITAL_COMMUNITY)
Admission: RE | Admit: 2021-03-01 | Discharge: 2021-03-01 | Disposition: A | Discharge status: Home / Self Care | Payer: Managed Care, Other (non HMO) | Attending: Internal Medicine | Admitting: Internal Medicine

## 2021-03-01 DIAGNOSIS — K921 Melena: Secondary | ICD-10-CM | POA: Diagnosis present

## 2021-03-01 DIAGNOSIS — F419 Anxiety disorder, unspecified: Secondary | ICD-10-CM | POA: Insufficient documentation

## 2021-03-01 DIAGNOSIS — F32A Depression, unspecified: Secondary | ICD-10-CM | POA: Insufficient documentation

## 2021-03-01 DIAGNOSIS — Z538 Procedure and treatment not carried out for other reasons: Secondary | ICD-10-CM | POA: Diagnosis not present

## 2021-03-01 DIAGNOSIS — J45909 Unspecified asthma, uncomplicated: Secondary | ICD-10-CM | POA: Diagnosis not present

## 2021-03-01 HISTORY — PX: FLEXIBLE SIGMOIDOSCOPY: SHX5431

## 2021-03-01 SURGERY — SIGMOIDOSCOPY, FLEXIBLE
Anesthesia: General

## 2021-03-01 MED ORDER — MIDAZOLAM HCL 2 MG/2ML IJ SOLN
INTRAMUSCULAR | Status: DC | PRN
  Administered 2021-03-01: 2 mg via INTRAVENOUS

## 2021-03-01 MED ORDER — MIDAZOLAM HCL 2 MG/2ML IJ SOLN
INTRAMUSCULAR | Status: AC
  Filled 2021-03-01: qty 2

## 2021-03-01 MED ORDER — PROPOFOL 10 MG/ML IV BOLUS
INTRAVENOUS | Status: DC | PRN
  Administered 2021-03-01: 100 mg via INTRAVENOUS

## 2021-03-01 MED ORDER — LIDOCAINE HCL (CARDIAC) PF 100 MG/5ML IV SOSY
PREFILLED_SYRINGE | INTRAVENOUS | Status: DC | PRN
  Administered 2021-03-01: 50 mg via INTRAVENOUS

## 2021-03-01 MED ORDER — LACTATED RINGERS IV SOLN
INTRAVENOUS | Status: DC
  Administered 2021-03-01: 1000 mL via INTRAVENOUS

## 2021-03-01 NOTE — Interval H&P Note (Signed)
History and Physical Interval Note:  03/01/2021 10:30 AM  Rebecca Medina  has presented today for surgery, with the diagnosis of hematochezia, RLQ pain.  The various methods of treatment have been discussed with the patient and family. After consideration of risks, benefits and other options for treatment, the patient has consented to  Procedure(s) with comments: COLONOSCOPY WITH PROPOFOL (N/A) - 11:45am as a surgical intervention.  The patient's history has been reviewed, patient examined, no change in status, stable for surgery.  I have reviewed the patient's chart and labs.  Questions were answered to the patient's satisfaction.     Lanelle Bal

## 2021-03-01 NOTE — Discharge Instructions (Addendum)
°  Colonoscopy Discharge Instructions  Read the instructions outlined below and refer to this sheet in the next few weeks. These discharge instructions provide you with general information on caring for yourself after you leave the hospital. Your doctor may also give you specific instructions. While your treatment has been planned according to the most current medical practices available, unavoidable complications occasionally occur.   ACTIVITY You may resume your regular activity, but move at a slower pace for the next 24 hours.  Take frequent rest periods for the next 24 hours.  Walking will help get rid of the air and reduce the bloated feeling in your belly (abdomen).  No driving for 24 hours (because of the medicine (anesthesia) used during the test).   Do not sign any important legal documents or operate any machinery for 24 hours (because of the anesthesia used during the test).  NUTRITION Drink plenty of fluids.  You may resume your normal diet as instructed by your doctor.  Begin with a light meal and progress to your normal diet. Heavy or fried foods are harder to digest and may make you feel sick to your stomach (nauseated).  Avoid alcoholic beverages for 24 hours or as instructed.  MEDICATIONS You may resume your normal medications unless your doctor tells you otherwise.  WHAT YOU CAN EXPECT TODAY Some feelings of bloating in the abdomen.  Passage of more gas than usual.  Spotting of blood in your stool or on the toilet paper.  IF YOU HAD POLYPS REMOVED DURING THE COLONOSCOPY: No aspirin products for 7 days or as instructed.  No alcohol for 7 days or as instructed.  Eat a soft diet for the next 24 hours.  FINDING OUT THE RESULTS OF YOUR TEST Not all test results are available during your visit. If your test results are not back during the visit, make an appointment with your caregiver to find out the results. Do not assume everything is normal if you have not heard from your  caregiver or the medical facility. It is important for you to follow up on all of your test results.  SEEK IMMEDIATE MEDICAL ATTENTION IF: You have more than a spotting of blood in your stool.  Your belly is swollen (abdominal distention).  You are nauseated or vomiting.  You have a temperature over 101.  You have abdominal pain or discomfort that is severe or gets worse throughout the day.   Unfortunately your colon was not adequately prepped.  Will need repeat colonoscopy in 3 months with different colon prep.  I hope you have a great rest of your week!  Hennie Duos. Marletta Lor, D.O. Gastroenterology and Hepatology [facility]

## 2021-03-01 NOTE — Anesthesia Postprocedure Evaluation (Signed)
Anesthesia Post Note  Patient: Rebecca Medina  Procedure(s) Performed: [place]  Patient location during evaluation: Endoscopy Anesthesia Type: General Level of consciousness: awake and alert Pain management: pain level controlled Vital Signs Assessment: post-procedure vital signs reviewed and stable Respiratory status: spontaneous breathing, nonlabored ventilation, respiratory function stable and patient connected to nasal cannula oxygen Cardiovascular status: blood pressure returned to baseline and stable Postop Assessment: no apparent nausea or vomiting Anesthetic complications: no   No notable events documented.   Last Vitals:  Vitals:   03/01/21 1033 03/01/21 1052  BP: 102/88 (!) 90/57  Pulse: 96   Resp: 17 (!) 26  Temp: 36.6 C 36.6 C  SpO2: 98% 95%    Last Pain:  Vitals:   03/01/21 1055  TempSrc:   PainSc: 0-No pain                 Trixie Rude

## 2021-03-01 NOTE — Op Note (Signed)
[facility] Patient Name: Rebecca Medina Procedure Date: 03/01/2021 10:30 AM MRN: [id number] Date of Birth: [birth year] Attending MD: Elon Alas. Abbey Chatters DO CSN: [id number] Age: 36 Admit Type: Outpatient Procedure:                Colonoscopy Indications:              Hematochezia Providers:                Elon Alas. Abbey Chatters, DO, Hughie Closs, RN, Janeece Riggers, RN, Randa Spike, Technician Referring MD:              Medicines:                See the Anesthesia note for documentation of the                            administered medications Complications:            No immediate complications. Estimated Blood Loss:     Estimated blood loss: none. Procedure:                Pre-Anesthesia Assessment:                           - The anesthesia plan was to use monitored                            anesthesia care (MAC).                           After obtaining informed consent, the colonoscope                            was passed under direct vision. Throughout the                            procedure, the patient's blood pressure, pulse, and                            oxygen saturations were monitored continuously. The                            [id number]-HQ190L ([id number]) was introduced through the                            anus with the intention of advancing to the cecum.                            The scope was advanced to the sigmoid colon before                            the procedure was aborted. Medications were given.                            The colonoscopy was performed without  difficulty.                            The patient tolerated the procedure well. The                            quality of the bowel preparation was evaluated                            using the BBPS [facility] Bowel Preparation Scale)                            with scores of: Left Colon = 0 (unprepared, mucosa                            not seen due to solid stool that  cannot be cleared                            or unseen proximal colon segment in a colonoscopy                            aborted due to inadequate bowel prep). The total                            BBPS score equals 0. The quality of the bowel                            preparation was inadequate. Scope In: 10:49:18 AM Scope Out: 10:50:10 AM Total Procedure Duration: 0 hours 0 minutes 52 seconds  Findings:      The perianal and digital rectal examinations were normal.      Extensive amounts of solid stool was found in the rectum and in the       sigmoid colon, precluding visualization. Impression:               - Preparation of the colon was inadequate.                           - Stool in the rectum and in the sigmoid colon.                           - No specimens collected. Moderate Sedation:      Per Anesthesia Care Recommendation:           - Patient has a contact number available for                            emergencies. The signs and symptoms of potential                            delayed complications were discussed with the                            patient. Return to normal activities tomorrow.  Written discharge instructions were provided to the                            patient.                           - Resume previous diet.                           - Continue present medications.                           - Repeat colonoscopy in 3 months because the bowel                            preparation was poor. Procedure Code(s):        --- Professional ---                           [phone], 24, Colonoscopy, flexible; diagnostic,                            including collection of specimen(s) by brushing or                            washing, when performed (separate procedure) Diagnosis Code(s):        --- Professional ---                           K92.1, Melena (includes Hematochezia) CPT copyright 2019 American [employer] Association. All rights  reserved. The codes documented in this report are preliminary and upon coder review may  be revised to meet current compliance requirements. Elon Alas. Abbey Chatters, DO [place], DO 03/01/2021 10:52:10 AM This report has been signed electronically. Number of Addenda: 0

## 2021-03-01 NOTE — Anesthesia Preprocedure Evaluation (Signed)
Anesthesia Evaluation  Patient identified by MRN, date of birth, ID band Patient awake    Reviewed: Allergy & Precautions, NPO status , Patient's Chart, lab work & pertinent test results  Airway Mallampati: I  TM Distance: >3 FB Neck ROM: Full    Dental no notable dental hx.    Pulmonary asthma ,    Pulmonary exam normal        Cardiovascular negative cardio ROS Normal cardiovascular exam     Neuro/Psych  Headaches (migraine), PSYCHIATRIC DISORDERS Anxiety Depression    GI/Hepatic negative GI ROS, Neg liver ROS,   Endo/Other  negative endocrine ROS  Renal/GU negative Renal ROS     Musculoskeletal negative musculoskeletal ROS (+)   Abdominal Normal abdominal exam  (+)   Peds  Hematology negative hematology ROS (+)   Anesthesia Other Findings   Reproductive/Obstetrics                             Anesthesia Physical Anesthesia Plan  ASA: 3  Anesthesia Plan: General   Post-op Pain Management:    Induction: Intravenous  PONV Risk Score and Plan: 2 and Propofol infusion and TIVA  Airway Management Planned: Natural Airway and Nasal Cannula  Additional Equipment:   Intra-op Plan:   Post-operative Plan:   Informed Consent: I have reviewed the patients History and Physical, chart, labs and discussed the procedure including the risks, benefits and alternatives for the proposed anesthesia with the patient or authorized representative who has indicated his/her understanding and acceptance.       Plan Discussed with: CRNA  Anesthesia Plan Comments:         Anesthesia Quick Evaluation

## 2021-03-01 NOTE — Transfer of Care (Signed)
Immediate Anesthesia Transfer of Care Note  Patient: Rebecca Medina  Procedure(s) Performed: FLEXIBLE SIGMOIDOSCOPY  Patient Location: Endoscopy Unit  Anesthesia Type:General  Level of Consciousness: awake  Airway & Oxygen Therapy: Patient Spontanous Breathing  Post-op Assessment: Report given to RN and Post -op Vital signs reviewed and stable  Post vital signs: Reviewed and stable  Last Vitals:  Vitals Value Taken Time  BP    Temp    Pulse    Resp    SpO2      Last Pain:  Vitals:   03/01/21 1033  TempSrc: Oral      Patients Stated Pain Goal: 9 ([id number])  Complications: No notable events documented.

## 2021-03-06 ENCOUNTER — Encounter (HOSPITAL_COMMUNITY): Payer: Self-pay | Admitting: Internal Medicine

## 2021-03-11 ENCOUNTER — Ambulatory Visit: Payer: Managed Care, Other (non HMO) | Admitting: Psychiatry

## 2021-03-11 ENCOUNTER — Encounter: Payer: Self-pay | Admitting: Psychiatry

## 2021-03-11 VITALS — BP 100/69 | HR 71 | Ht 70.0 in | Wt 192.0 lb

## 2021-03-11 DIAGNOSIS — G43719 Chronic migraine without aura, intractable, without status migrainosus: Secondary | ICD-10-CM

## 2021-03-11 MED ORDER — ZOLMITRIPTAN 5 MG PO TABS: 5.0000 mg | ORAL_TABLET | ORAL | 3 refills | Status: DC | PRN

## 2021-03-11 NOTE — Patient Instructions (Signed)
Start Botox for headache prevention. The office will reach out to you to schedule this once your insurance approves it  Start Zomig as needed for migraines.  Take at the onset of migraine. If headache recurs or does not fully resolve, you may take a second dose after 2 hours. Please avoid taking more than 2 days per week

## 2021-03-11 NOTE — Progress Notes (Signed)
° °  CC:  headaches  Follow-up Visit  Last visit: 12/10/20  Brief HPI: 36 year old female with a history of depression, asthma, seasonal allergies who follows in clinic for chronic migraines.  At her last visit she was started on Topamax for prevention and Ubrelvy for rescue.  Interval History: Since her last visit she has not noticed improvement in her headaches. Topamax caused everything to taste bad which made food inedible. She was unable to tolerate it so she stopped it. Roselyn Meier did not make much of a difference. She continues to have a headache most days.  Headache days per month: 30 Headache free days per month: 0  Current Headache Regimen: Preventative: none Abortive: Ubrelvy 100 mg PRN  Prior Therapies                                  Topamax 100 mg QHS - taste changes Effexor - tremors, mood changes Citalopram 40 mg daily - suicidality Imitrex - lack of efficacy Maxalt - lack of efficacy Ubrelvy - lack of efficacy  Physical Exam:   Vital Signs: BP 100/69    Pulse 71    Ht 5\' 10"  (1.778 m)    Wt 192 lb (87.1 kg)    BMI 27.55 kg/m  GENERAL:  well appearing, in no acute distress, alert  SKIN:  Color, texture, turgor normal. No rashes or lesions HEAD:  Normocephalic/atraumatic. RESP: normal respiratory effort MSK:  No gross joint deformities.   NEUROLOGICAL: Mental Status: Alert, oriented to person, place and time, Follows commands, and Speech fluent and appropriate. Cranial Nerves: PERRL, face symmetric, no dysarthria, hearing grossly intact Motor: moves all extremities equally Gait: normal-based.  IMPRESSION: 36 year old female with a history of depression, asthma, seasonal allergies who presents for follow up of headaches. She was unable to tolerate Topamax and did not see much improvement with Roselyn Meier for rescue. She has now failed multiple medications due to inability to tolerate side effects. Will start Botox for migraine prevention. Will switch to Zomig for  rescue. She currently has an implant and cannot get an MRI until it is removed in late February. If no improvement after first Botox session will consider MRI brain.  PLAN: -Prevention: Start Botox -Rescue: Start Zomig 5 mg PRN -Next steps: consider CGRP, qulipta  Follow-up: for Botox  I spent a total of 25 minutes on the date of the service. Headache education was done. Discussed treatment options including preventive and acute medications. Discussed medication side effects, adverse reactions and drug interactions. Written educational materials and patient instructions outlining all of the above were given.  Genia Harold, MD 03/11/21 2:56 PM

## 2021-03-12 ENCOUNTER — Telehealth: Payer: Self-pay | Admitting: Psychiatry

## 2021-03-12 NOTE — Telephone Encounter (Signed)
Completed Botox PA form. Placed in Nurse Pod for MD signature.

## 2021-03-12 NOTE — Telephone Encounter (Signed)
Faxed signed PA form with OV notes to [employer]. ? ? ?

## 2021-03-20 ENCOUNTER — Telehealth: Payer: Self-pay

## 2021-03-20 NOTE — Telephone Encounter (Signed)
Received message for [facility] OR staff the [employer] Rep is unavailable 02/24 for requested surgery date.  Reviewed dates with Judeth Cornfield- confirmed March 1st at 7:30 case.   I spoke with Victorino Dike. We have discussed possible surgery dates and 04/10/2021 was agreed upon by all parties. Patient given information about surgery date, what to expect pre-operatively and post operatively. She will call our office back if that date is not suit her with work.    We discussed that a pre-op nurse will be calling to set up the pre-op visit that will take place prior to surgery. Informed patient that our office will communicate any additional care to be provided after surgery.    Patients questions or concerns were discussed during our call. Advised to call our office should there be any additional information, questions or concerns that arise. Patient verbalized understanding.

## 2021-03-27 ENCOUNTER — Ambulatory Visit (HOSPITAL_COMMUNITY): Payer: 59 | Admitting: Clinical

## 2021-04-02 NOTE — Patient Instructions (Signed)
Rebecca Medina  04/02/2021     @PREFPERIOPPHARMACY @   Your procedure is scheduled on  04/10/2021.   Report to [name] at  [phone] A.M.   Call this number if you have problems the morning of surgery:  [phone]   Remember:  Do not eat or drink after midnight.      Take these medicines the morning of surgery with A SIP OF WATER                                   zomig(if needed).     Do not wear jewelry, make-up or nail polish.  Do not wear lotions, powders, or perfumes, or deodorant.  Do not shave 48 hours prior to surgery.  Men may shave face and neck.  Do not bring valuables to the hospital.  [facility] is not responsible for any belongings or valuables.  Contacts, dentures or bridgework may not be worn into surgery.  Leave your suitcase in the car.  After surgery it may be brought to your room.  For patients admitted to the hospital, discharge time will be determined by your treatment team.  Patients discharged the day of surgery will not be allowed to drive home and must have someone with them for 24 hours.    Special instructions:   DO NOT smoke tobacco or vape for 24 hours before your procedure.  Please read over the following fact sheets that you were given. Coughing and Deep Breathing, Surgical Site Infection Prevention, Anesthesia Post-op Instructions, and Care and Recovery After Surgery      Urethral Vaginal Sling, Care After The following information offers guidance on how to care for yourself after your procedure. Your health care provider may also give you more specific instructions. If you have problems or questions, contact your health care provider. What can I expect after the procedure? After the procedure: It is common to have some pain in the abdomen and in the pelvis. Your health care provider will give you pain medicines for this. You may have a gauze packing in the vagina to prevent bleeding. This will be removed in 1-2  days. Follow these instructions at home: Medicines Take over-the-counter and prescription medicines only as told by your health care provider. Do not take aspirin because it can cause bleeding. If you were prescribed an antibiotic medicine, take it as told by your health care provider. Do not stop taking the antibiotic even if you start to feel better. Ask your health care provider if the medicine prescribed to you: Requires you to avoid driving or using machinery. Can cause constipation. You may need to take these actions to prevent or treat constipation: Drink enough fluid to keep your urine pale yellow. Take over-the-counter or prescription medicines. Eat foods that are high in fiber, such as beans, whole grains, and fresh fruits and vegetables. Limit foods that are high in fat and processed sugars, such as fried or sweet foods. Incision care  Follow instructions from your health care provider about how to take care of your incision. Make sure you: Wash your hands with soap and water for at least 20 seconds before and after you change your bandage (dressing). If soap and water are not available, use hand sanitizer. Change your dressing as told by your health care provider. Leave stitches (sutures), skin glue, or adhesive strips in place. These skin closures  may need to stay in place for 2 weeks or longer. If adhesive strip edges start to loosen and curl up, you may trim the loose edges. Do not remove adhesive strips completely unless your health care provider tells you to do that. Check your incision area every day for signs of infection. Check for: Redness, swelling, or more pain. Fluid or blood. Warmth. Pus or a bad smell. Activity  Rest as told by your health care provider. Avoid sitting for a long time without moving. Get up to take short walks every 1-2 hours. This is important to improve blood flow and breathing. Ask for help if you feel weak or unsteady. Limit exercise and  activities as told by your health care provider. Do not lift anything that is heavier than 5 lb (2.3 kg) until your health care provider says that it is safe. Do not douche, use tampons, or have sex for 6 weeks after your procedure, or as told by your health care provider. Return to your normal activities as told by your health care provider. Ask your health care provider what activities are safe for you. General instructions If you have a urinary catheter in place, follow instructions from your health care provider about how to empty the catheter bag. Do not take baths, swim, or use a hot tub until your health care provider approves. Ask your health care provider if you may take showers. You may only be allowed to take sponge baths. Do not use any products that contain nicotine or tobacco. These products include cigarettes, chewing tobacco, and vaping devices, such as e-cigarettes. If you need help quitting, ask your health care provider. You may resume your usual diet. Eat a well-balanced diet. Avoid straining when having a bowel movement. Keep all follow-up visits. This is important. Contact a health care provider if: You have a heavy or bad-smelling discharge from your vagina. You have new or worsening bruising in the vaginal area. You have pain that is not controlled with medicines. Your incision feels warm to the touch. You have redness, swelling, or more pain around your incision. You have pus or a bad smell coming from your incision. You have fluid or blood coming from your incision. You feel faint or light-headed. You have a rash. Get help right away if: You have a fever. You faint. You have shortness of breath. You have chest or leg pain. You have worsening pain in the abdomen or pelvis. You cannot urinate or you have pain when you urinate. You have a catheter and it becomes blocked. You have bleeding in your vagina. You have swelling, redness, and pain in the vaginal  area. These symptoms may represent a serious problem that is an emergency. Do not wait to see if the symptoms will go away. Get medical help right away. Call your local emergency services (911 in the [place] Do not drive yourself to the hospital. Summary After the procedure, it is common to have some abdominal pain. Your health care provider will give you pain medicines for this. Follow instructions from your health care provider about how to take care of your incision. Limit exercise and activities as told by your health care provider. Contact your health care provider if you have any signs of infection after your surgery. This information is not intended to replace advice given to you by your health care provider. Make sure you discuss any questions you have with your health care provider. Document Revised: 09/02/2019 Document Reviewed: 09/02/2019 Elsevier Patient Education  2022 [employer] General Anesthesia, Adult, Care After This sheet gives you information about how to care for yourself after your procedure. Your health care provider may also give you more specific instructions. If you have problems or questions, contact your health care provider. What can I expect after the procedure? After the procedure, the following side effects are common: Pain or discomfort at the IV site. Nausea. Vomiting. Sore throat. Trouble concentrating. Feeling cold or chills. Feeling weak or tired. Sleepiness and fatigue. Soreness and body aches. These side effects can affect parts of the body that were not involved in surgery. Follow these instructions at home: For the time period you were told by your health care provider:  Rest. Do not participate in activities where you could fall or become injured. Do not drive or use machinery. Do not drink alcohol. Do not take sleeping pills or medicines that cause drowsiness. Do not make important decisions or sign legal documents. Do not take care of  children on your own. Eating and drinking Follow any instructions from your health care provider about eating or drinking restrictions. When you feel hungry, start by eating small amounts of foods that are soft and easy to digest (bland), such as toast. Gradually return to your regular diet. Drink enough fluid to keep your urine pale yellow. If you vomit, rehydrate by drinking water, juice, or clear broth. General instructions If you have sleep apnea, surgery and certain medicines can increase your risk for breathing problems. Follow instructions from your health care provider about wearing your sleep device: Anytime you are sleeping, including during daytime naps. While taking prescription pain medicines, sleeping medicines, or medicines that make you drowsy. Have a responsible adult stay with you for the time you are told. It is important to have someone help care for you until you are awake and alert. Return to your normal activities as told by your health care provider. Ask your health care provider what activities are safe for you. Take over-the-counter and prescription medicines only as told by your health care provider. If you smoke, do not smoke without supervision. Keep all follow-up visits as told by your health care provider. This is important. Contact a health care provider if: You have nausea or vomiting that does not get better with medicine. You cannot eat or drink without vomiting. You have pain that does not get better with medicine. You are unable to pass urine. You develop a skin rash. You have a fever. You have redness around your IV site that gets worse. Get help right away if: You have difficulty breathing. You have chest pain. You have blood in your urine or stool, or you vomit blood. Summary After the procedure, it is common to have a sore throat or nausea. It is also common to feel tired. Have a responsible adult stay with you for the time you are told. It is  important to have someone help care for you until you are awake and alert. When you feel hungry, start by eating small amounts of foods that are soft and easy to digest (bland), such as toast. Gradually return to your regular diet. Drink enough fluid to keep your urine pale yellow. Return to your normal activities as told by your health care provider. Ask your health care provider what activities are safe for you. This information is not intended to replace advice given to you by your health care provider. Make sure you discuss any questions you have with your health care provider.  Document Revised: 10/13/2019 Document Reviewed: 05/12/2019 Elsevier Patient Education  2022 [employer] How to Use Chlorhexidine for Bathing Chlorhexidine gluconate (CHG) is a germ-killing (antiseptic) solution that is used to clean the skin. It can get rid of the bacteria that normally live on the skin and can keep them away for about 24 hours. To clean your skin with CHG, you may be given: A CHG solution to use in the shower or as part of a sponge bath. A prepackaged cloth that contains CHG. Cleaning your skin with CHG may help lower the risk for infection: While you are staying in the intensive care unit of the hospital. If you have a vascular access, such as a central line, to provide short-term or long-term access to your veins. If you have a catheter to drain urine from your bladder. If you are on a ventilator. A ventilator is a machine that helps you breathe by moving air in and out of your lungs. After surgery. What are the risks? Risks of using CHG include: A skin reaction. Hearing loss, if CHG gets in your ears and you have a perforated eardrum. Eye injury, if CHG gets in your eyes and is not rinsed out. The CHG product catching fire. Make sure that you avoid smoking and flames after applying CHG to your skin. Do not use CHG: If you have a chlorhexidine allergy or have previously reacted to  chlorhexidine. On babies younger than 412 months of age. How to use CHG solution Use CHG only as told by your health care provider, and follow the instructions on the label. Use the full amount of CHG as directed. Usually, this is one bottle. During a shower Follow these steps when using CHG solution during a shower (unless your health care provider gives you different instructions): Start the shower. Use your normal soap and shampoo to wash your face and hair. Turn off the shower or move out of the shower stream. Pour the CHG onto a clean washcloth. Do not use any type of brush or rough-edged sponge. Starting at your neck, lather your body down to your toes. Make sure you follow these instructions: If you will be having surgery, pay special attention to the part of your body where you will be having surgery. Scrub this area for at least 1 minute. Do not use CHG on your head or face. If the solution gets into your ears or eyes, rinse them well with water. Avoid your genital area. Avoid any areas of skin that have broken skin, cuts, or scrapes. Scrub your back and under your arms. Make sure to wash skin folds. Let the lather sit on your skin for 1-2 minutes or as long as told by your health care provider. Thoroughly rinse your entire body in the shower. Make sure that all body creases and crevices are rinsed well. Dry off with a clean towel. Do not put any substances on your body afterward--such as powder, lotion, or perfume--unless you are told to do so by your health care provider. Only use lotions that are recommended by the manufacturer. Put on clean clothes or pajamas. If it is the night before your surgery, sleep in clean sheets.  During a sponge bath Follow these steps when using CHG solution during a sponge bath (unless your health care provider gives you different instructions): Use your normal soap and shampoo to wash your face and hair. Pour the CHG onto a clean washcloth. Starting  at your neck, lather your body down to  your toes. Make sure you follow these instructions: If you will be having surgery, pay special attention to the part of your body where you will be having surgery. Scrub this area for at least 1 minute. Do not use CHG on your head or face. If the solution gets into your ears or eyes, rinse them well with water. Avoid your genital area. Avoid any areas of skin that have broken skin, cuts, or scrapes. Scrub your back and under your arms. Make sure to wash skin folds. Let the lather sit on your skin for 1-2 minutes or as long as told by your health care provider. Using a different clean, wet washcloth, thoroughly rinse your entire body. Make sure that all body creases and crevices are rinsed well. Dry off with a clean towel. Do not put any substances on your body afterward--such as powder, lotion, or perfume--unless you are told to do so by your health care provider. Only use lotions that are recommended by the manufacturer. Put on clean clothes or pajamas. If it is the night before your surgery, sleep in clean sheets. How to use CHG prepackaged cloths Only use CHG cloths as told by your health care provider, and follow the instructions on the label. Use the CHG cloth on clean, dry skin. Do not use the CHG cloth on your head or face unless your health care provider tells you to. When washing with the CHG cloth: Avoid your genital area. Avoid any areas of skin that have broken skin, cuts, or scrapes. Before surgery Follow these steps when using a CHG cloth to clean before surgery (unless your health care provider gives you different instructions): Using the CHG cloth, vigorously scrub the part of your body where you will be having surgery. Scrub using a back-and-forth motion for 3 minutes. The area on your body should be completely wet with CHG when you are done scrubbing. Do not rinse. Discard the cloth and let the area air-dry. Do not put any substances on  the area afterward, such as powder, lotion, or perfume. Put on clean clothes or pajamas. If it is the night before your surgery, sleep in clean sheets.  For general bathing Follow these steps when using CHG cloths for general bathing (unless your health care provider gives you different instructions). Use a separate CHG cloth for each area of your body. Make sure you wash between any folds of skin and between your fingers and toes. Wash your body in the following order, switching to a new cloth after each step: The front of your neck, shoulders, and chest. Both of your arms, under your arms, and your hands. Your stomach and groin area, avoiding the genitals. Your right leg and foot. Your left leg and foot. The back of your neck, your back, and your buttocks. Do not rinse. Discard the cloth and let the area air-dry. Do not put any substances on your body afterward--such as powder, lotion, or perfume--unless you are told to do so by your health care provider. Only use lotions that are recommended by the manufacturer. Put on clean clothes or pajamas. Contact a health care provider if: Your skin gets irritated after scrubbing. You have questions about using your solution or cloth. You swallow any chlorhexidine. Call your local poison control center [phone])639-7785 in the [place] Get help right away if: Your eyes itch badly, or they become very red or swollen. Your skin itches badly and is red or swollen. Your hearing changes. You have trouble seeing. You  have swelling or tingling in your mouth or throat. You have trouble breathing. These symptoms may represent a serious problem that is an emergency. Do not wait to see if the symptoms will go away. Get medical help right away. Call your local emergency services (911 in the [place] Do not drive yourself to the hospital. Summary Chlorhexidine gluconate (CHG) is a germ-killing (antiseptic) solution that is used to clean the skin. Cleaning your skin  with CHG may help to lower your risk for infection. You may be given CHG to use for bathing. It may be in a bottle or in a prepackaged cloth to use on your skin. Carefully follow your health care provider's instructions and the instructions on the product label. Do not use CHG if you have a chlorhexidine allergy. Contact your health care provider if your skin gets irritated after scrubbing. This information is not intended to replace advice given to you by your health care provider. Make sure you discuss any questions you have with your health care provider. Document Revised: 04/09/2020 Document Reviewed: 04/09/2020 Elsevier Patient Education  2022 [employer].

## 2021-04-03 ENCOUNTER — Encounter (HOSPITAL_COMMUNITY)
Admission: RE | Admit: 2021-04-03 | Discharge: 2021-04-03 | Disposition: A | Discharge status: Home / Self Care | Payer: Managed Care, Other (non HMO) | Source: Ambulatory Visit | Attending: Urology | Admitting: Urology

## 2021-04-03 ENCOUNTER — Encounter (HOSPITAL_COMMUNITY): Payer: Self-pay

## 2021-04-03 ENCOUNTER — Other Ambulatory Visit: Payer: Self-pay

## 2021-04-10 ENCOUNTER — Other Ambulatory Visit: Payer: Self-pay

## 2021-04-10 ENCOUNTER — Ambulatory Visit (HOSPITAL_COMMUNITY): Payer: Managed Care, Other (non HMO) | Admitting: Anesthesiology

## 2021-04-10 ENCOUNTER — Ambulatory Visit (HOSPITAL_COMMUNITY)
Admission: RE | Admit: 2021-04-10 | Discharge: 2021-04-10 | Disposition: A | Discharge status: Home / Self Care | Payer: Managed Care, Other (non HMO) | Attending: Urology | Admitting: Urology

## 2021-04-10 ENCOUNTER — Encounter (HOSPITAL_COMMUNITY): Payer: Self-pay | Admitting: Urology

## 2021-04-10 ENCOUNTER — Ambulatory Visit (HOSPITAL_BASED_OUTPATIENT_CLINIC_OR_DEPARTMENT_OTHER): Payer: Managed Care, Other (non HMO) | Admitting: Anesthesiology

## 2021-04-10 ENCOUNTER — Encounter (HOSPITAL_COMMUNITY)
Admission: RE | Disposition: A | Discharge status: Home / Self Care | Payer: Self-pay | Source: Home / Self Care | Attending: Urology

## 2021-04-10 ENCOUNTER — Ambulatory Visit (HOSPITAL_COMMUNITY): Payer: Managed Care, Other (non HMO)

## 2021-04-10 DIAGNOSIS — Z90710 Acquired absence of both cervix and uterus: Secondary | ICD-10-CM | POA: Diagnosis not present

## 2021-04-10 DIAGNOSIS — T85199A Other mechanical complication of other implanted electronic stimulator of nervous system, initial encounter: Secondary | ICD-10-CM

## 2021-04-10 DIAGNOSIS — T85193A Other mechanical complication of implanted electronic neurostimulator, generator, initial encounter: Secondary | ICD-10-CM | POA: Diagnosis present

## 2021-04-10 DIAGNOSIS — Z90722 Acquired absence of ovaries, bilateral: Secondary | ICD-10-CM | POA: Diagnosis not present

## 2021-04-10 DIAGNOSIS — T83190A Other mechanical complication of urinary electronic stimulator device, initial encounter: Secondary | ICD-10-CM | POA: Diagnosis not present

## 2021-04-10 DIAGNOSIS — Z9079 Acquired absence of other genital organ(s): Secondary | ICD-10-CM | POA: Insufficient documentation

## 2021-04-10 DIAGNOSIS — Y848 Other medical procedures as the cause of abnormal reaction of the patient, or of later complication, without mention of misadventure at the time of the procedure: Secondary | ICD-10-CM | POA: Diagnosis not present

## 2021-04-10 DIAGNOSIS — R102 Pelvic and perineal pain unspecified side: Secondary | ICD-10-CM

## 2021-04-10 DIAGNOSIS — Y753 Surgical instruments, materials and neurological devices (including sutures) associated with adverse incidents: Secondary | ICD-10-CM | POA: Diagnosis not present

## 2021-04-10 HISTORY — PX: INTERSTIM IMPLANT REMOVAL: SHX5131

## 2021-04-10 SURGERY — REMOVAL, NEUROSTIMULATOR, SACRAL
Anesthesia: General | Site: Back

## 2021-04-10 MED ORDER — ROCURONIUM BROMIDE 10 MG/ML (PF) SYRINGE
PREFILLED_SYRINGE | INTRAVENOUS | Status: AC
  Filled 2021-04-10: qty 10

## 2021-04-10 MED ORDER — DEXMEDETOMIDINE (PRECEDEX) IN NS 20 MCG/5ML (4 MCG/ML) IV SYRINGE
PREFILLED_SYRINGE | INTRAVENOUS | Status: AC
  Filled 2021-04-10: qty 5

## 2021-04-10 MED ORDER — ONDANSETRON HCL 4 MG/2ML IJ SOLN: 4.0000 mg | Freq: Once | INTRAMUSCULAR | Status: DC | PRN

## 2021-04-10 MED ORDER — ORAL CARE MOUTH RINSE: 15.0000 mL | Freq: Once | OROMUCOSAL | Status: AC

## 2021-04-10 MED ORDER — FENTANYL CITRATE PF 50 MCG/ML IJ SOSY
25.0000 ug | PREFILLED_SYRINGE | INTRAMUSCULAR | Status: DC | PRN
  Administered 2021-04-10: 50 ug via INTRAVENOUS
  Filled 2021-04-10: qty 1

## 2021-04-10 MED ORDER — ONDANSETRON HCL 4 MG/2ML IJ SOLN
INTRAMUSCULAR | Status: DC | PRN
  Administered 2021-04-10: 4 mg via INTRAVENOUS

## 2021-04-10 MED ORDER — 0.9 % SODIUM CHLORIDE (POUR BTL) OPTIME
TOPICAL | Status: DC | PRN
Start: 2021-04-10 — End: 2021-04-10
  Administered 2021-04-10: 1000 mL

## 2021-04-10 MED ORDER — LIDOCAINE HCL (PF) 2 % IJ SOLN
INTRAMUSCULAR | Status: AC
  Filled 2021-04-10: qty 5

## 2021-04-10 MED ORDER — SUGAMMADEX SODIUM 200 MG/2ML IV SOLN
INTRAVENOUS | Status: DC | PRN
Start: 2021-04-10 — End: 2021-04-10
  Administered 2021-04-10: 168.8 mg via INTRAVENOUS

## 2021-04-10 MED ORDER — CEFAZOLIN SODIUM-DEXTROSE 2-4 GM/100ML-% IV SOLN
2.0000 g | INTRAVENOUS | Status: AC
  Administered 2021-04-10: 2 g via INTRAVENOUS
  Filled 2021-04-10: qty 100

## 2021-04-10 MED ORDER — FENTANYL CITRATE (PF) 100 MCG/2ML IJ SOLN
INTRAMUSCULAR | Status: AC
  Filled 2021-04-10: qty 2

## 2021-04-10 MED ORDER — KETOROLAC TROMETHAMINE 30 MG/ML IJ SOLN
INTRAMUSCULAR | Status: AC
  Filled 2021-04-10: qty 1

## 2021-04-10 MED ORDER — HYDROCODONE-ACETAMINOPHEN 5-325 MG PO TABS
1.0000 | ORAL_TABLET | Freq: Four times a day (QID) | ORAL | 0 refills | Status: DC | PRN
Start: 2021-04-10 — End: 2021-04-22

## 2021-04-10 MED ORDER — ONDANSETRON HCL 4 MG/2ML IJ SOLN
INTRAMUSCULAR | Status: AC
  Filled 2021-04-10: qty 2

## 2021-04-10 MED ORDER — KETOROLAC TROMETHAMINE 30 MG/ML IJ SOLN
INTRAMUSCULAR | Status: DC | PRN
  Administered 2021-04-10: 30 mg via INTRAVENOUS

## 2021-04-10 MED ORDER — PROPOFOL 10 MG/ML IV BOLUS
INTRAVENOUS | Status: DC | PRN
  Administered 2021-04-10: 200 mg via INTRAVENOUS

## 2021-04-10 MED ORDER — PROPOFOL 10 MG/ML IV BOLUS
INTRAVENOUS | Status: AC
Start: 2021-04-10 — End: ?
  Filled 2021-04-10: qty 20

## 2021-04-10 MED ORDER — MIDAZOLAM HCL 2 MG/2ML IJ SOLN
INTRAMUSCULAR | Status: AC
Start: 2021-04-10 — End: ?
  Filled 2021-04-10: qty 2

## 2021-04-10 MED ORDER — LIDOCAINE 2% (20 MG/ML) 5 ML SYRINGE
INTRAMUSCULAR | Status: DC | PRN
  Administered 2021-04-10: 80 mg via INTRAVENOUS

## 2021-04-10 MED ORDER — DEXAMETHASONE SODIUM PHOSPHATE 10 MG/ML IJ SOLN
INTRAMUSCULAR | Status: AC
  Filled 2021-04-10: qty 1

## 2021-04-10 MED ORDER — BUPIVACAINE HCL 0.25 % IJ SOLN
INTRAMUSCULAR | Status: DC | PRN
  Administered 2021-04-10: 10 mL

## 2021-04-10 MED ORDER — CHLORHEXIDINE GLUCONATE 0.12 % MT SOLN
15.0000 mL | Freq: Once | OROMUCOSAL | Status: AC
  Administered 2021-04-10: 15 mL via OROMUCOSAL

## 2021-04-10 MED ORDER — DEXAMETHASONE SODIUM PHOSPHATE 10 MG/ML IJ SOLN
INTRAMUSCULAR | Status: DC | PRN
  Administered 2021-04-10: 10 mg via INTRAVENOUS

## 2021-04-10 MED ORDER — ROCURONIUM BROMIDE 10 MG/ML (PF) SYRINGE
PREFILLED_SYRINGE | INTRAVENOUS | Status: DC | PRN
  Administered 2021-04-10: 20 mg via INTRAVENOUS
  Administered 2021-04-10: 50 mg via INTRAVENOUS

## 2021-04-10 MED ORDER — BUPIVACAINE-EPINEPHRINE (PF) 0.5% -1:200000 IJ SOLN
INTRAMUSCULAR | Status: AC
  Filled 2021-04-10: qty 30

## 2021-04-10 MED ORDER — FENTANYL CITRATE (PF) 250 MCG/5ML IJ SOLN
INTRAMUSCULAR | Status: DC | PRN
  Administered 2021-04-10: 50 ug via INTRAVENOUS
  Administered 2021-04-10 (×2): 25 ug via INTRAVENOUS

## 2021-04-10 MED ORDER — LACTATED RINGERS IV SOLN: INTRAVENOUS | Status: DC

## 2021-04-10 MED ORDER — LIDOCAINE-EPINEPHRINE (PF) 1 %-1:200000 IJ SOLN
INTRAMUSCULAR | Status: AC
  Filled 2021-04-10: qty 30

## 2021-04-10 MED ORDER — MIDAZOLAM HCL 5 MG/5ML IJ SOLN
INTRAMUSCULAR | Status: DC | PRN
  Administered 2021-04-10 (×2): 2 mg via INTRAVENOUS

## 2021-04-10 MED ORDER — MEPERIDINE HCL 50 MG/ML IJ SOLN: 6.2500 mg | INTRAMUSCULAR | Status: DC | PRN

## 2021-04-10 MED ORDER — BUPIVACAINE HCL (PF) 0.25 % IJ SOLN
INTRAMUSCULAR | Status: AC
  Filled 2021-04-10: qty 30

## 2021-04-10 MED ORDER — MIDAZOLAM HCL 2 MG/2ML IJ SOLN
INTRAMUSCULAR | Status: AC
  Filled 2021-04-10: qty 2

## 2021-04-10 MED ORDER — MIDAZOLAM HCL 2 MG/2ML IJ SOLN: 2.0000 mg | Freq: Once | INTRAMUSCULAR | Status: DC | PRN

## 2021-04-10 SURGICAL SUPPLY — 38 items
ADH SKN CLS APL DERMABOND .7 (GAUZE/BANDAGES/DRESSINGS)
APL SKNCLS STERI-STRIP NONHPOA (GAUZE/BANDAGES/DRESSINGS) ×1
BENZOIN TINCTURE PRP APPL 2/3 (GAUZE/BANDAGES/DRESSINGS) ×1 IMPLANT
CLOSURE STERI STRIP 1/2 X4 (GAUZE/BANDAGES/DRESSINGS) ×1 IMPLANT
COVER SURGICAL LIGHT HANDLE (MISCELLANEOUS) ×4 IMPLANT
DERMABOND ADVANCED (GAUZE/BANDAGES/DRESSINGS)
DERMABOND ADVANCED .7 DNX12 (GAUZE/BANDAGES/DRESSINGS) ×1 IMPLANT
DRAPE C-ARM FOLDED MOBILE STRL (DRAPES) ×1 IMPLANT
DRAPE HALF SHEET 40X57 (DRAPES) ×1 IMPLANT
DRAPE INCISE 23X17 IOBAN STRL (DRAPES) ×1
DRAPE INCISE 23X17 STRL (DRAPES) ×1 IMPLANT
DRAPE INCISE IOBAN 23X17 STRL (DRAPES) ×1 IMPLANT
DRAPE LAPAROSCOPIC ABDOMINAL (DRAPES) ×2 IMPLANT
DRSG TEGADERM 2-3/8X2-3/4 SM (GAUZE/BANDAGES/DRESSINGS) ×1 IMPLANT
DRSG TEGADERM 4X4.75 (GAUZE/BANDAGES/DRESSINGS) ×3 IMPLANT
DRSG TELFA 3X8 NADH (GAUZE/BANDAGES/DRESSINGS) IMPLANT
ELECT REM PT RETURN 9FT ADLT (ELECTROSURGICAL) ×2
ELECTRODE REM PT RTRN 9FT ADLT (ELECTROSURGICAL) ×1 IMPLANT
GAUZE SPONGE 4X4 12PLY STRL (GAUZE/BANDAGES/DRESSINGS) ×1 IMPLANT
GLOVE SURG ENC MOIS LTX SZ7 (GLOVE) ×2 IMPLANT
GLOVE SURG EUDERMIC 8 LTX PF (GLOVE) ×2 IMPLANT
GOWN STRL REUS W/TWL LRG LVL3 (GOWN DISPOSABLE) ×2 IMPLANT
GOWN STRL REUS W/TWL XL LVL3 (GOWN DISPOSABLE) ×2 IMPLANT
KIT TURNOVER KIT A (KITS) ×2 IMPLANT
NDL HYPO 25X1 1.5 SAFETY (NEEDLE) IMPLANT
NEEDLE HYPO 25X1 1.5 SAFETY (NEEDLE) ×2 IMPLANT
NS IRRIG 1000ML POUR BTL (IV SOLUTION) ×1 IMPLANT
PACK MINOR (CUSTOM PROCEDURE TRAY) ×2 IMPLANT
PAD ARMBOARD 7.5X6 YLW CONV (MISCELLANEOUS) ×2 IMPLANT
PAD DRESSING TELFA 3X8 NADH (GAUZE/BANDAGES/DRESSINGS) ×1 IMPLANT
SET BASIN LINEN APH (SET/KITS/TRAYS/PACK) ×2 IMPLANT
SUT ETHILON 3 0 PS 1 (SUTURE) ×3 IMPLANT
SUT VIC AB 2-0 UR6 27 (SUTURE) ×4 IMPLANT
SUT VIC AB 3-0 SH 27 (SUTURE) ×2
SUT VIC AB 3-0 SH 27X BRD (SUTURE) IMPLANT
SUT VIC AB 4-0 PS2 27 (SUTURE) ×3 IMPLANT
SYR CONTROL 10ML LL (SYRINGE) ×1 IMPLANT
WATER STERILE IRR 1000ML POUR (IV SOLUTION) IMPLANT

## 2021-04-10 NOTE — Transfer of Care (Signed)
Immediate Anesthesia Transfer of Care Note ? ?Patient: Rebecca Medina ? ?Procedure(s) Performed: REMOVAL OF INTERSTIM IMPLANT (Back) ? ?Patient Location: PACU ? ?Anesthesia Type:General ? ?Level of Consciousness: awake, alert  and oriented ? ?Airway & Oxygen Therapy: Patient Spontanous Breathing and Patient connected to nasal cannula oxygen ? ?Post-op Assessment: Report given to RN and Post -op Vital signs reviewed and stable ? ?Post vital signs: Reviewed ? ?Last Vitals:  ?Vitals Value Taken Time  ?BP 104/64 04/10/21 0915  ?Temp 98.0   ?Pulse 76 04/10/21 0916  ?Resp 10 04/10/21 0916  ?SpO2 95 % 04/10/21 0916  ?Vitals shown include unvalidated device data. ? ?Last Pain:  ?Vitals:  ? 04/10/21 0659  ?PainSc: 0-No pain  ?   ? ?  ? ?Complications: No notable events documented. ?

## 2021-04-10 NOTE — Interval H&P Note (Signed)
History and Physical Interval Note: ? ?04/10/2021 ?7:11 AM ? ?Rebecca Medina  has presented today for surgery, with the diagnosis of Non-Functioning Interstim, Pelvic Pain.  The various methods of treatment have been discussed with the patient and family. After consideration of risks, benefits and other options for treatment, the patient has consented to  Procedure(s) with comments: ?REMOVAL OF INTERSTIM IMPLANT (N/A) - InterStim Rep to be present as a surgical intervention.  The patient's history has been reviewed, patient examined, no change in status, stable for surgery.  I have reviewed the patient's chart and labs.  Questions were answered to the patient's satisfaction.   ? ? ?Rebecca Medina ? ? ?
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
## 2021-04-10 NOTE — Anesthesia Preprocedure Evaluation (Signed)
Anesthesia Evaluation  ?Patient identified by MRN, date of birth, ID band ?Patient awake ? ? ? ?Reviewed: ?Allergy & Precautions, NPO status , Patient's Chart, lab work & pertinent test results ? ?Airway ?Mallampati: I ? ?TM Distance: >3 FB ?Neck ROM: Full ? ? ? Dental ? ?(+) Dental Advisory Given, Teeth Intact ?  ?Pulmonary ?asthma ,  ?  ?Pulmonary exam normal ?breath sounds clear to auscultation ? ? ? ? ? ? Cardiovascular ?negative cardio ROS ?Normal cardiovascular exam ?Rhythm:Regular Rate:Normal ? ? ?  ?Neuro/Psych ? Headaches, PSYCHIATRIC DISORDERS Anxiety Depression   ? GI/Hepatic ?negative GI ROS, Neg liver ROS,   ?Endo/Other  ?negative endocrine ROS ? Renal/GU ?negative Renal ROS  ?negative genitourinary ?  ?Musculoskeletal ?negative musculoskeletal ROS ?(+)  ? Abdominal ?  ?Peds ?negative pediatric ROS ?(+)  Hematology ?negative hematology ROS ?(+)   ?Anesthesia Other Findings ? ? Reproductive/Obstetrics ?negative OB ROS ? ?  ? ? ? ? ? ? ? ? ? ? ? ? ? ?  ?  ? ? ? ? ? ? ? ?Anesthesia Physical ?Anesthesia Plan ? ?ASA: 2 ? ?Anesthesia Plan: General  ? ?Post-op Pain Management: Dilaudid IV  ? ?Induction: Intravenous ? ?PONV Risk Score and Plan: Ondansetron, Dexamethasone and Midazolam ? ?Airway Management Planned: Oral ETT ? ?Additional Equipment:  ? ?Intra-op Plan:  ? ?Post-operative Plan: Extubation in OR ? ?Informed Consent: I have reviewed the patients History and Physical, chart, labs and discussed the procedure including the risks, benefits and alternatives for the proposed anesthesia with the patient or authorized representative who has indicated his/her understanding and acceptance.  ? ? ? ?Dental advisory given ? ?Plan Discussed with: CRNA and Surgeon ? ?Anesthesia Plan Comments:   ? ? ? ? ? ?Anesthesia Quick Evaluation ? ?

## 2021-04-10 NOTE — Anesthesia Procedure Notes (Signed)
Procedure Name: Intubation ?Date/Time: 04/10/2021 7:41 AM ?Performed by: Cy Blamer, CRNA ?Pre-anesthesia Checklist: Patient identified, Emergency Drugs available, Suction available and Patient being monitored ?Patient Re-evaluated:Patient Re-evaluated prior to induction ?Oxygen Delivery Method: Circle system utilized ?Preoxygenation: Pre-oxygenation with 100% oxygen ?Induction Type: IV induction ?Ventilation: Mask ventilation without difficulty ?Laryngoscope Size: Hyacinth Meeker and 2 ?Grade View: Grade II ?Tube type: Oral ?Tube size: 7.0 mm ?Number of attempts: 1 ?Airway Equipment and Method: Stylet and Bite block ?Placement Confirmation: ETT inserted through vocal cords under direct vision, positive ETCO2 and breath sounds checked- equal and bilateral ?Secured at: 21 cm ?Tube secured with: Tape ?Dental Injury: Teeth and Oropharynx as per pre-operative assessment  ? ? ? ? ?

## 2021-04-10 NOTE — Op Note (Signed)
OPERATIVE NOTE ? ? ?Patient Name: Rebecca Medina ? ?MRN:  [id number] ? ? ?Date of Procedure: 04/10/21 ? ? ?Preoperative diagnosis:  ?Nonfunctioning InterStim device ? ?Postoperative diagnosis:  ?Nonfunctioning InterStim device ? ?Procedure:  ?Removal of InterStim device ? ?Attending: Milderd Meager, MD ? ?Anesthesia: General with local ? ?Estimated blood loss: 10 ml ? ?Fluids: Per anesthesia record ? ?Drains: None ? ?Specimens: Interstim battery and tined lead ? ?Antibiotics: Ancef 2 gm IV ? ?Findings: Interstim battery in right gluteal area; tined lead in left sacral foramen ? ?Indications:  ?36 year old female status post placement of an InterStim in 2017 in [place] for management of chronic pelvic pain.  She reported that the InterStim device never functioned as planned and she has not used the InterStim device for the past 5 years.  Her pelvic pain resolved after undergoing hysterectomy and bilateral salpingo-oophorectomy for endometriosis.  She is not having any voiding symptoms.  She requests removal of the InterStim device at this time.  The procedure including potential risk discussed with the patient.  She understands wishes to proceed as described. ? ?Description of Procedure:  ?The patient received IV Ancef preoperatively.  After successful induction of a general anesthetic, the patient was placed on the operating table in the prone position.  All pressure points were appropriately padded.  The patient's low back and gluteal area were prepped and draped in sterile fashion.  Fluoroscopy demonstrated the InterStim battery in the right gluteal area and the tined lead in the left sacral foramen.  A incision was made in the right upper gluteal area over the site of the battery and in the same location as her prior incision.  This was carried down through the subcutaneous tissue.  The battery was identified and dissected out of its capsule.  The lead was identified and traced toward its insertion in the left  sacral area.  The lead was cut and the battery was removed from the field.  Using fluoroscopy as a guide, the location of the lead was identified in the left lower back.  A vertical incision was made in this area.  The incision was carried down through the subcutaneous tissue with the cautery.  The lead was identified and brought into this incision.  Blunt dissection using a hemostat was performed along the lead down toward the sacral foramen.  Dissection was carried out in an approximately 60 degree angle following the angle of the lead.  Gentle traction was placed on the lead allowing complete removal of the lead.  Visual inspection and fluoroscopy confirmed removal of the entire lead.  No remaining components were identified.  The lead was passed off of the field.  Hemostasis was obtained with the cautery.  Both incisions were irrigated.  The incisions were closed with interrupted 3-0 Vicryl suture for the subcutaneous tissues.  A 4-0 Vicryl suture was used in a subcuticular fashion for closure.  0.25% plain Marcaine was injected into both incisions prior to closure.  A total of 10 mL was used.  Steri-Strips and sterile dressings were applied.  The patient was then placed back into the supine position and was extubated.  She was taken to the post anesthesia care unit in stable condition. ? ?Complications: None ? ?Condition: Stable, extubated, transferred to PACU ? ?Plan:  ?D/C home today ?

## 2021-04-10 NOTE — Anesthesia Postprocedure Evaluation (Signed)
Anesthesia Post Note ? ?Patient: Lakyia Behe ? ?Procedure(s) Performed: REMOVAL OF INTERSTIM IMPLANT (Back) ? ?Patient location during evaluation: Phase II ?Anesthesia Type: General ?Level of consciousness: awake and alert and oriented ?Pain management: pain level controlled ?Vital Signs Assessment: post-procedure vital signs reviewed and stable ?Respiratory status: spontaneous breathing, nonlabored ventilation and respiratory function stable ?Cardiovascular status: blood pressure returned to baseline and stable ?Postop Assessment: no apparent nausea or vomiting ?Anesthetic complications: no ? ? ?No notable events documented. ? ? ?Last Vitals:  ?Vitals:  ? 04/10/21 1000 04/10/21 1010  ?BP: 113/71 116/80  ?Pulse: 76 81  ?Resp: 17 17  ?Temp:  36.6 ?C  ?SpO2: 100% 98%  ?  ?Last Pain:  ?Vitals:  ? 04/10/21 1021  ?TempSrc:   ?PainSc: 2   ? ? ?  ?  ?  ?  ?  ?  ? ?Tristina Sahagian C Isrrael Fluckiger ? ? ? ? ?

## 2021-04-11 ENCOUNTER — Other Ambulatory Visit: Payer: Self-pay | Admitting: Psychiatry

## 2021-04-11 NOTE — Telephone Encounter (Addendum)
Please send Botox Rx to [facility]. °

## 2021-04-11 NOTE — Telephone Encounter (Signed)
I see [place] on her list but not [facility], is [facility] the correct pharmacy to send to?

## 2021-04-11 NOTE — Telephone Encounter (Signed)
Received approval from [facility] for Botox. PA # [id number] (03/26/2021-03/26/2022). ?

## 2021-04-12 ENCOUNTER — Other Ambulatory Visit: Payer: Self-pay | Admitting: Psychiatry

## 2021-04-12 LAB — SURGICAL PATHOLOGY

## 2021-04-12 MED ORDER — BOTOX 200 UNITS IJ SOLR: 200.0000 [IU] | INTRAMUSCULAR | 11 refills | Status: DC

## 2021-04-12 NOTE — Telephone Encounter (Signed)
Rx sent to her pharmacy, thanks

## 2021-04-15 ENCOUNTER — Encounter: Payer: Self-pay | Admitting: Psychiatry

## 2021-04-15 ENCOUNTER — Other Ambulatory Visit: Payer: Self-pay | Admitting: Psychiatry

## 2021-04-15 ENCOUNTER — Encounter (HOSPITAL_COMMUNITY): Payer: Self-pay | Admitting: Urology

## 2021-04-15 MED ORDER — METHYLPREDNISOLONE 4 MG PO TBPK: ORAL_TABLET | ORAL | 0 refills | Status: DC

## 2021-04-18 ENCOUNTER — Telehealth: Payer: Self-pay

## 2021-04-18 NOTE — Telephone Encounter (Signed)
Patient left says she originallly left a voicemail on Wed.  04-17-2021.  No call back. ? ?She left a voicmail on Thurs. 04-18-2021. ? ?Needing a return call regarding after procedure care. ? ?Please advise. ? ?Thanks, ?Rosey Bath ? ? ?

## 2021-04-18 NOTE — Telephone Encounter (Signed)
Returned call to patient. No answer. No way to leave message  ?

## 2021-04-19 NOTE — Telephone Encounter (Signed)
Patient called back advising she needed a note for work for work restrictions. 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 She wanted to know if we could write a note stating same.  ?

## 2021-04-22 ENCOUNTER — Other Ambulatory Visit: Payer: Self-pay

## 2021-04-22 ENCOUNTER — Ambulatory Visit: Payer: Managed Care, Other (non HMO) | Admitting: Internal Medicine

## 2021-04-22 ENCOUNTER — Encounter: Payer: Self-pay | Admitting: Internal Medicine

## 2021-04-22 VITALS — BP 106/66 | HR 94 | Temp 98.5°F | Ht 70.0 in | Wt 190.0 lb

## 2021-04-22 DIAGNOSIS — M25562 Pain in left knee: Secondary | ICD-10-CM | POA: Diagnosis not present

## 2021-04-22 DIAGNOSIS — M25561 Pain in right knee: Secondary | ICD-10-CM | POA: Diagnosis not present

## 2021-04-22 DIAGNOSIS — F32A Depression, unspecified: Secondary | ICD-10-CM

## 2021-04-22 DIAGNOSIS — H6063 Unspecified chronic otitis externa, bilateral: Secondary | ICD-10-CM | POA: Diagnosis not present

## 2021-04-22 DIAGNOSIS — R45851 Suicidal ideations: Secondary | ICD-10-CM
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
## 2021-04-22 NOTE — Patient Instructions (Addendum)
[facility] ?Lorenda Cahill ?[phone] ? ? ?Acute Knee Pain, Adult ?Many things can cause knee pain. Sometimes, knee pain is sudden (acute) and may be caused by damage, swelling, or irritation of the muscles and tissues that support your knee. ?The pain often goes away on its own with time and rest. If the pain does not go away, tests may be done to find out what is causing the pain. ?Follow these instructions at home: ?If you have a knee sleeve or brace: ? ?Wear the knee sleeve or brace as told by your doctor. Take it off only as told by your doctor. ?Loosen it if your toes: ?Tingle. ?Become numb. ?Turn cold and blue. ?Keep it clean. ?If the knee sleeve or brace is not waterproof: ?Do not let it get wet. ?Cover it with a watertight covering when you take a bath or shower. ?Activity ?Rest your knee. ?Do not do things that cause pain or make pain worse. ?Avoid activities where both feet leave the ground at the same time (high-impact activities). Examples are running, jumping rope, and doing jumping jacks. ?Work with a physical therapist to make a safe exercise program, as told by your doctor. ?Managing pain, stiffness, and swelling ? ?If told, put ice on the knee. To do this: ?If you have a removable knee sleeve or brace, take it off as told by your doctor. ?Put ice in a plastic bag. ?Place a towel between your skin and the bag. ?Leave the ice on for 20 minutes, 2-3 times a day. ?Take off the ice if your skin turns bright red. This is very important. If you cannot feel pain, heat, or cold, you have a greater risk of damage to the area. ?If told, use an elastic bandage to put pressure (compression) on your injured knee. ?Raise your knee above the level of your heart while you are sitting or lying down. ?Sleep with a pillow under your knee. ?General instructions ?Take over-the-counter and prescription medicines only as told by your doctor. ?Do not smoke or use any products that contain nicotine or tobacco. If  you need help quitting, ask your doctor. ?If you are overweight, work with your doctor and a food expert (dietitian) to set goals to lose weight. Being overweight can make your knee hurt more. ?Watch for any changes in your symptoms. ?Keep all follow-up visits. ?Contact a doctor if: ?The knee pain does not stop. ?The knee pain changes or gets worse. ?You have a fever along with knee pain. ?Your knee is red or feels warm when you touch it. ?Your knee gives out or locks up. ?Get help right away if: ?Your knee swells, and the swelling gets worse. ?You cannot move your knee. ?You have very bad knee pain that does not get better with pain medicine. ?Summary ?Many things can cause knee pain. The pain often goes away on its own with time and rest. ?Your doctor may do tests to find out the cause of the pain. ?Watch for any changes in your symptoms. Relieve your pain with rest, medicines, light activity, and use of ice. ?Get help right away if you cannot move your knee or your knee pain is very bad. ?This information is not intended to replace advice given to you by your health care provider. Make sure you discuss any questions you have with your health care provider. ?Document Revised: 07/13/2019 Document Reviewed: 07/13/2019 ?Elsevier Patient Education ? 2022 [employer] ? ?

## 2021-04-24 NOTE — Progress Notes (Signed)
? ? Rebecca Medina D.Judd Gaudier ?[facility] ?[address] [place] [address] ?Phone: [phone] ?  ?Assessment and Plan:   ?  ?1. Patellofemoral syndrome of both knees ?2. Chronic pain of both knees ?-Chronic with exacerbation, initial sports medicine visit ?- Suspect chronic patellofemoral syndrome with acute flare based on HPI, physical exam ?- Start HEP and physical therapy ?- May use Tylenol/NSAIDs, or Voltaren gel as needed for pain control ?- Ambulatory referral to Physical Therapy ? ?3. Tendinopathy of right rotator cuff ?4. Chronic right shoulder pain ?-Chronic with exacerbation, initial sports medicine visit ?- Chronic shoulder pain since 2018, with decreased rotation ?- Suspect chronic rotator cuff tendinopathy, most specifically in subscapularis and supraspinatus ?- Start HEP and physical therapy ?- May use Tylenol/NSAIDs or Voltaren gel as needed for pain control ?- Ambulatory referral to Physical Therapy  ?  ?Pertinent previous records reviewed include none ?  ?Follow Up: 4 weeks for reevaluation.  Would consider x-rays if no improvement or worsening of symptoms at that time ?  ?Subjective:   ?I, Rebecca Medina, am serving as a Neurosurgeon for Doctor [employer] ? ?Chief Complaint: bilateral knee pain  ? ?HPI:  ?04/25/2021 ?Patient is a 36 year old female complaining of bilateral knee pain. Patient states complains of a several week hx of bilateral knee pain. No history of trauma/injury left knee isn't as bad as right knee gets shooting pain if she flexes to far she sleeps in the fetal position so she doesn't get great sleep . She states that she has always had knee pain notes she grew really fast, no numbness or tingling, lots of clicking and popping, is a sober person would like to not take anything for pain.  She does not take anything for pain, her right shoulder has had issues since 2018 ? ?Relevant Historical Information: None pertinent ? ?Additional pertinent review  of systems negative. ? ? ?Current Outpatient Medications:  ?  Botulinum Toxin Type A (BOTOX) 200 units SOLR, Inject 200 Units as directed every 3 (three) months., Disp: 1 each, Rfl: 11 ?  Melatonin 10 MG TABS, Take 10 mg by mouth at bedtime., Disp: , Rfl:  ?  Multiple Vitamin (MULTIVITAMIN WITH MINERALS) TABS tablet, Take 1 tablet by mouth every evening., Disp: , Rfl:  ?  zolmitriptan (ZOMIG) 5 MG tablet, Take 1 tablet (5 mg total) by mouth as needed for migraine., Disp: 10 tablet, Rfl: 3  ? ?Objective:   ?  ?Vitals:  ? 04/25/21 0919  ?BP: 118/72  ?Pulse: 89  ?SpO2: 99%  ?Weight: 190 lb (86.2 kg)  ?Height: 5\' 10"  (1.778 m)  ?  ?  ?Body mass index is 27.26 kg/m?.  ?  ?Physical Exam:   ? ?General:  awake, alert oriented, no acute distress nontoxic ?Skin: no suspicious lesions or rashes ?Neuro:sensation intact, no deficits, strength 5/5 with no deficits, no atrophy, normal muscle tone ?Psych: No signs of anxiety, depression or other mood disorder ? ?Bilateral knee: ?No swelling ?No deformity ?Neg fluid wave, joint milking ?ROM Flex 110 , Ext 0  ?TTP patella, plica, medial joint line ?NTTP over the quad tendon, medial fem condyle, lat fem condyle,  patella tendon, tibial tuberostiy, fibular head, posterior fossa, pes anserine bursa, gerdy's tubercle,   lateral jt line ?Neg anterior and posterior drawer ?Neg lachman ?Neg sag sign ?Negative varus stress ?Negative valgus stress ?Negative McMurray ?Negative Thessaly ? ?Gait normal  ? ?  ? ?Right shoulder: no deformity, swelling or muscle  wasting ?No scapular winging ?FF 180, abd 180, int 20, ext 90 ?NTTP over the [place], clavicle, ac, coracoid, biceps groove, humerus, deltoid, trapezius, cervical spine ?  ?FROM of neck  ? ?Electronically signed by:  ?Rebecca Medina D.Judd Gaudier ?[facility] ?10:10 AM 04/25/21 ?

## 2021-04-25 ENCOUNTER — Ambulatory Visit: Payer: Managed Care, Other (non HMO) | Admitting: Sports Medicine

## 2021-04-25 ENCOUNTER — Other Ambulatory Visit: Payer: Self-pay

## 2021-04-25 VITALS — BP 118/72 | HR 89 | Ht 70.0 in | Wt 190.0 lb

## 2021-04-25 DIAGNOSIS — G8929 Other chronic pain: Secondary | ICD-10-CM

## 2021-04-25 DIAGNOSIS — M25561 Pain in right knee: Secondary | ICD-10-CM | POA: Diagnosis not present

## 2021-04-25 DIAGNOSIS — M222X1 Patellofemoral disorders, right knee: Secondary | ICD-10-CM

## 2021-04-25 DIAGNOSIS — M67911 Unspecified disorder of synovium and tendon, right shoulder: Secondary | ICD-10-CM

## 2021-04-25 DIAGNOSIS — M222X2 Patellofemoral disorders, left knee: Secondary | ICD-10-CM

## 2021-04-25 DIAGNOSIS — M25511 Pain in right shoulder: Secondary | ICD-10-CM | POA: Diagnosis not present

## 2021-04-25 DIAGNOSIS — M25562 Pain in left knee: Secondary | ICD-10-CM

## 2021-04-25 NOTE — Patient Instructions (Addendum)
Good to see you  ?Referral to PT for right shoulder rotator cuff and IR  ?Bilateral knee patellar femoral syndrome  ?Shoulder and knee HEP  ?Tylenol, Ibuprofen and Voltaren gel as needed  ?4 week follow up  ?

## 2021-05-01 ENCOUNTER — Ambulatory Visit (INDEPENDENT_AMBULATORY_CARE_PROVIDER_SITE_OTHER): Payer: Managed Care, Other (non HMO) | Admitting: Urology

## 2021-05-01 ENCOUNTER — Encounter: Payer: Self-pay | Admitting: Urology

## 2021-05-01 ENCOUNTER — Other Ambulatory Visit: Payer: Self-pay

## 2021-05-01 VITALS — BP 114/77 | HR 71

## 2021-05-01 DIAGNOSIS — T83190D Other mechanical complication of urinary electronic stimulator device, subsequent encounter: Secondary | ICD-10-CM
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
## 2021-05-07 ENCOUNTER — Ambulatory Visit: Payer: PRIVATE HEALTH INSURANCE | Admitting: Psychiatry

## 2021-05-10 ENCOUNTER — Telehealth: Payer: Self-pay | Admitting: Internal Medicine

## 2021-05-10 NOTE — Telephone Encounter (Signed)
No note needed 

## 2021-05-14 ENCOUNTER — Ambulatory Visit: Payer: Self-pay | Admitting: Internal Medicine

## 2021-05-23 ENCOUNTER — Ambulatory Visit: Payer: PRIVATE HEALTH INSURANCE | Admitting: Psychiatry

## 2021-05-23 ENCOUNTER — Ambulatory Visit: Payer: Managed Care, Other (non HMO) | Admitting: Sports Medicine

## 2021-05-28 ENCOUNTER — Encounter: Payer: Self-pay | Admitting: *Deleted

## 2021-05-28 ENCOUNTER — Ambulatory Visit: Payer: Managed Care, Other (non HMO) | Admitting: Internal Medicine

## 2021-06-06 ENCOUNTER — Ambulatory Visit: Payer: Managed Care, Other (non HMO) | Admitting: Sports Medicine

## 2021-06-17 NOTE — Progress Notes (Deleted)
    Aleen Sells D.[facility] [address] [place] [address] Phone: [phone]   Assessment and Plan:     There are no diagnoses linked to this encounter.  ***   Pertinent previous records reviewed include ***   Follow Up: ***     Subjective:   I, Rebecca Medina, am serving as a Neurosurgeon for Doctor Richardean Sale   Chief Complaint: bilateral knee pain    HPI:  04/25/2021 Patient is a 36 year old female complaining of bilateral knee pain. Patient states complains of a several week hx of bilateral knee pain. No history of trauma/injury left knee isn't as bad as right knee gets shooting pain if she flexes to far she sleeps in the fetal position so she doesn't get great sleep . She states that she has always had knee pain notes she grew really fast, no numbness or tingling, lots of clicking and popping, is a sober person would like to not take anything for pain.  She does not take anything for pain, her right shoulder has had issues since 2018  06/18/2021 Patient states   Relevant Historical Information: ***  Additional pertinent review of systems negative.   Current Outpatient Medications:    Botulinum Toxin Type A (BOTOX) 200 units SOLR, Inject 200 Units as directed every 3 (three) months., Disp: 1 each, Rfl: 11   buPROPion (WELLBUTRIN XL) 150 MG 24 hr tablet, Take 150 mg by mouth daily., Disp: , Rfl:    estradiol (ESTRACE) 1 MG tablet, Take 1 mg by mouth daily., Disp: , Rfl:    Melatonin 10 MG TABS, Take 10 mg by mouth at bedtime., Disp: , Rfl:    Multiple Vitamin (MULTIVITAMIN WITH MINERALS) TABS tablet, Take 1 tablet by mouth every evening., Disp: , Rfl:    zolmitriptan (ZOMIG) 5 MG tablet, Take 1 tablet (5 mg total) by mouth as needed for migraine., Disp: 10 tablet, Rfl: 3   Objective:     There were no vitals filed for this visit.    There is no height or weight on file to calculate BMI.    Physical Exam:     ***   Electronically signed by:  Aleen Sells D.[facility] 12:42 PM 06/17/21

## 2021-06-18 ENCOUNTER — Ambulatory Visit: Payer: Managed Care, Other (non HMO) | Admitting: Sports Medicine

## 2021-06-27 ENCOUNTER — Ambulatory Visit: Payer: PRIVATE HEALTH INSURANCE | Admitting: Psychiatry

## 2021-06-27 VITALS — BP 119/79 | HR 96

## 2021-06-27 DIAGNOSIS — G43019 Migraine without aura, intractable, without status migrainosus: Secondary | ICD-10-CM | POA: Diagnosis not present

## 2021-06-27 NOTE — Progress Notes (Signed)
Botox- 200 units x 1 vial Lot: [id number] Expiration: 01/2024 NDC: 8841-6606-30   Bacteriostatic 0.9% Sodium Chloride- 84mL total [id number] Expiration: 03/2022 NDC: 5573-2202-54   Dx: G43.719 SP

## 2021-06-27 NOTE — Progress Notes (Signed)
[facility]  BOTULINUM TOXIN INJECTION PROCEDURE NOTE  Patient: Rebecca Medina   MRN: [id number]  Indication: Chronic migraines   History: 36 year old female with a history of depression, asthma, seasonal allergies who follows in clinic for chronic migraines  Last injection: First injection today  Technique: Informed consent was obtained and signed. 200 units onabotulinumtoxinA (Lot# [id number]; Expiration: 01/2024) were reconstituted using normal saline, to a concentration of 5 units per 0.55ml. 155 units were injected using sterile technique across 31 sites as follows: corrugator 10, procerus 5 units, frontalis 20 units, temporalis 40 units, occipitalis 30 units, cervical paraspinal 20 units, trapezius 30 units. 45 units were wasted. Patient tolerated procedure without complication.  Plan: -Rescue: Continue Zomig 5 mg PRN  -Return for Botox in 3 months

## 2021-07-03 ENCOUNTER — Encounter: Payer: Self-pay | Admitting: Internal Medicine

## 2021-07-23 ENCOUNTER — Ambulatory Visit: Payer: Managed Care, Other (non HMO) | Admitting: Internal Medicine

## 2021-07-29 ENCOUNTER — Ambulatory Visit: Payer: Managed Care, Other (non HMO) | Admitting: Gastroenterology
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
## 2021-08-07 ENCOUNTER — Ambulatory Visit: Payer: Managed Care, Other (non HMO) | Admitting: Gastroenterology

## 2021-08-07 ENCOUNTER — Encounter: Payer: Self-pay | Admitting: Gastroenterology

## 2021-08-07 VITALS — BP 114/68 | HR 86 | Temp 97.5°F | Ht 70.0 in | Wt 190.4 lb

## 2021-08-07 DIAGNOSIS — K625 Hemorrhage of anus and rectum: Secondary | ICD-10-CM

## 2021-08-07 NOTE — Patient Instructions (Signed)
Will have you follow up as needed. Please return if you have recurrent rectal bleeding, dizziness, lightheadedness, syncope episodes, etc.   I hope you enjoy your summer!  It was a pleasure to see you today. I want to create trusting relationships with patients. If you receive a survey regarding your visit,  I greatly appreciate you taking time to fill this out on paper or through your MyChart. I value your feedback.  Brooke Bonito, MSN, FNP-BC, AGACNP-BC [facility]

## 2021-09-19 ENCOUNTER — Telehealth: Payer: Self-pay | Admitting: Psychiatry

## 2021-09-19 NOTE — Telephone Encounter (Signed)
This was an error, the botox will be here on Tuesday.

## 2021-09-19 NOTE — Telephone Encounter (Signed)
Pt said, pharmacy informed that insurance denied claim due to Botox is not covered. Would like a call back.

## 2021-09-23 ENCOUNTER — Ambulatory Visit: Payer: PRIVATE HEALTH INSURANCE | Admitting: Psychiatry

## 2021-09-24 ENCOUNTER — Ambulatory Visit: Payer: Managed Care, Other (non HMO) | Admitting: Psychiatry

## 2021-09-24 VITALS — BP 123/89 | HR 93

## 2021-09-24 DIAGNOSIS — G43709 Chronic migraine without aura, not intractable, without status migrainosus: Secondary | ICD-10-CM

## 2021-09-24 DIAGNOSIS — G43019 Migraine without aura, intractable, without status migrainosus: Secondary | ICD-10-CM

## 2021-09-24 DIAGNOSIS — G43719 Chronic migraine without aura, intractable, without status migrainosus: Secondary | ICD-10-CM | POA: Diagnosis not present

## 2021-09-24 MED ORDER — ZOLMITRIPTAN 5 MG PO TABS
5.0000 mg | ORAL_TABLET | ORAL | 11 refills | Status: DC | PRN
Start: 2021-09-24 — End: 2022-05-01

## 2021-09-24 MED ORDER — ONABOTULINUMTOXINA 200 UNITS IJ SOLR
155.0000 [IU] | Freq: Once | INTRAMUSCULAR | Status: AC
  Administered 2021-09-24: 155 [IU] via INTRAMUSCULAR

## 2021-09-24 NOTE — Progress Notes (Signed)
Botox- 200 units x 1 vial Lot: [id number] Expiration: 2/26 NDC: 2863-8177-11   Bacteriostatic 0.9% Sodium Chloride- 30mL total Lot: [id number] Expiration: 12 Oct 2022 NDC: 8333-8329-19   Dx: G43.019 SP

## 2021-09-24 NOTE — Progress Notes (Signed)
[facility]  BOTULINUM TOXIN INJECTION PROCEDURE NOTE  Patient: Rebecca Medina   MRN: [id number]  Indication: Chronic migraines   History: 36 year old female with a history of depression, asthma, seasonal allergies who follows in clinic for chronic migraines  Last injection: 06/27/21, 2nd injection today  Technique: Informed consent was obtained and signed. 200 units onabotulinumtoxinA (Lot# [id number]; Expiration: 2/26) were reconstituted using normal saline, to a concentration of 5 units per 0.18ml. 155 units were injected using sterile technique across 31 sites as follows: corrugator 10, procerus 5 units, frontalis 20 units, temporalis 40 units, occipitalis 30 units, cervical paraspinal 20 units, trapezius 30 units. 45 units were wasted. Patient tolerated procedure without complication.    Prior Therapies                                  Topamax 100 mg QHS - taste changes Effexor - tremors, mood changes Citalopram 40 mg daily - suicidality Botox Imitrex - lack of efficacy Maxalt - lack of efficacy Ubrelvy - lack of efficacy Zomig - helps   Plan: -Continue Zomig 5 mg PRN -Return for Botox in 3 months

## 2021-10-29 ENCOUNTER — Telehealth: Payer: Self-pay | Admitting: Psychiatry

## 2021-10-29 NOTE — Telephone Encounter (Signed)
Pt rescheduled Botox appt due to was not allowed to get off of work on 12/19/21.  Rescheduled on 12/26/21

## 2021-11-14 ENCOUNTER — Telehealth: Payer: Self-pay | Admitting: *Deleted

## 2021-11-14 NOTE — Telephone Encounter (Signed)
Roselyn Meier PA, Key: [id number], did not complete PA, med was discontinued by MD- ineffective.

## 2021-12-19 ENCOUNTER — Ambulatory Visit: Payer: Managed Care, Other (non HMO) | Admitting: Psychiatry

## 2021-12-23 ENCOUNTER — Ambulatory Visit: Payer: Managed Care, Other (non HMO) | Admitting: Family Medicine

## 2021-12-23 ENCOUNTER — Encounter: Payer: Self-pay | Admitting: Family Medicine

## 2021-12-23 ENCOUNTER — Ambulatory Visit (HOSPITAL_BASED_OUTPATIENT_CLINIC_OR_DEPARTMENT_OTHER)
Admission: RE | Admit: 2021-12-23 | Discharge: 2021-12-23 | Disposition: A | Discharge status: Home / Self Care | Payer: Managed Care, Other (non HMO) | Source: Ambulatory Visit | Attending: Family Medicine | Admitting: Family Medicine

## 2021-12-23 VITALS — BP 116/74 | HR 80 | Temp 97.8°F | Wt 176.4 lb

## 2021-12-23 DIAGNOSIS — R399 Unspecified symptoms and signs involving the genitourinary system: Secondary | ICD-10-CM | POA: Diagnosis present

## 2021-12-23 DIAGNOSIS — R10819 Abdominal tenderness, unspecified site: Secondary | ICD-10-CM

## 2021-12-23 DIAGNOSIS — N3001 Acute cystitis with hematuria: Secondary | ICD-10-CM

## 2021-12-23 LAB — POCT URINALYSIS DIPSTICK
Bilirubin, UA: NEGATIVE
Glucose, UA: NEGATIVE
Ketones, UA: NEGATIVE
Leukocytes, UA: NEGATIVE
Nitrite, UA: NEGATIVE
Protein, UA: POSITIVE — AB
Spec Grav, UA: 1.025 (ref 1.010–1.025)
Urobilinogen, UA: 0.2 E.U./dL
pH, UA: 6 (ref 5.0–8.0)

## 2021-12-23 MED ORDER — IOHEXOL 300 MG/ML  SOLN
100.0000 mL | Freq: Once | INTRAMUSCULAR | Status: AC | PRN
  Administered 2021-12-23: 100 mL via INTRAVENOUS

## 2021-12-23 MED ORDER — LIDOCAINE HCL 1 % IJ SOLN
1000.0000 mg | Freq: Once | INTRAMUSCULAR | Status: AC
  Administered 2021-12-23: 1000 mg via INTRAMUSCULAR

## 2021-12-23 MED ORDER — KETOROLAC TROMETHAMINE 60 MG/2ML IM SOLN
60.0000 mg | Freq: Once | INTRAMUSCULAR | Status: AC
  Administered 2021-12-23: 60 mg via INTRAMUSCULAR

## 2021-12-23 MED ORDER — CEFTRIAXONE SODIUM 1 G IJ SOLR: 1.0000 g | Freq: Once | INTRAMUSCULAR | Status: DC

## 2021-12-23 MED ORDER — IBUPROFEN 600 MG PO TABS: 600.0000 mg | ORAL_TABLET | Freq: Three times a day (TID) | ORAL | 0 refills | Status: DC | PRN

## 2021-12-23 MED ORDER — CEFTRIAXONE SODIUM 250 MG IJ SOLR: 250.0000 mg | Freq: Once | INTRAMUSCULAR | Status: DC

## 2021-12-23 MED ORDER — CEFDINIR 300 MG PO CAPS: 300.0000 mg | ORAL_CAPSULE | Freq: Two times a day (BID) | ORAL | 0 refills | Status: AC

## 2021-12-23 MED ORDER — ONDANSETRON HCL 4 MG PO TABS: 4.0000 mg | ORAL_TABLET | Freq: Three times a day (TID) | ORAL | 0 refills | Status: DC | PRN

## 2021-12-23 NOTE — Patient Instructions (Signed)
Stop ciprofloxacin, start cefdinir. May take ibuprofen for pain and Zofran as needed for nausea. We are ordering labs and a CT scan. You should be called to set up imaging.  Go to ED if no improvement or getting worse
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
## 2021-12-24 ENCOUNTER — Other Ambulatory Visit: Payer: Self-pay | Admitting: Family Medicine

## 2021-12-25 LAB — BASIC METABOLIC PANEL
BUN/Creatinine Ratio: 15 (ref 9–23)
BUN: 12 mg/dL (ref 6–20)
CO2: 22 mmol/L (ref 20–29)
Calcium: 9 mg/dL (ref 8.7–10.2)
Chloride: 101 mmol/L (ref 96–106)
Creatinine, Ser: 0.78 mg/dL (ref 0.57–1.00)
Glucose: 82 mg/dL (ref 70–99)
Potassium: 4.3 mmol/L (ref 3.5–5.2)
Sodium: 137 mmol/L (ref 134–144)
eGFR: 102 mL/min/{1.73_m2} (ref >59)

## 2021-12-25 LAB — CBC WITH DIFFERENTIAL/PLATELET
Basophils Absolute: 0 10*3/uL (ref 0.0–0.2)
Basos: 1 %
EOS (ABSOLUTE): 0.1 10*3/uL (ref 0.0–0.4)
Eos: 1 %
Hematocrit: 37 % (ref 34.0–46.6)
Hemoglobin: 12.6 g/dL (ref 11.1–15.9)
Immature Grans (Abs): 0 10*3/uL (ref 0.0–0.1)
Immature Granulocytes: 0 %
Lymphocytes Absolute: 1.5 10*3/uL (ref 0.7–3.1)
Lymphs: 31 %
MCH: 29.4 pg (ref 26.6–33.0)
MCHC: 34.1 g/dL (ref 31.5–35.7)
MCV: 86 fL (ref 79–97)
Monocytes Absolute: 0.4 10*3/uL (ref 0.1–0.9)
Monocytes: 9 %
Neutrophils Absolute: 2.8 10*3/uL (ref 1.4–7.0)
Neutrophils: 58 %
Platelets: 288 10*3/uL (ref 150–450)
RBC: 4.28 x10E6/uL (ref 3.77–5.28)
RDW: 12.2 % (ref 11.7–15.4)
WBC: 4.8 10*3/uL (ref 3.4–10.8)

## 2021-12-25 LAB — URINE CULTURE
MICRO NUMBER:: 14186786
Result:: NO GROWTH
SPECIMEN QUALITY:: ADEQUATE

## 2021-12-26 ENCOUNTER — Ambulatory Visit: Payer: Managed Care, Other (non HMO) | Admitting: Psychiatry

## 2021-12-26 VITALS — BP 113/77 | HR 71

## 2021-12-26 DIAGNOSIS — G43709 Chronic migraine without aura, not intractable, without status migrainosus: Secondary | ICD-10-CM

## 2021-12-26 MED ORDER — ONABOTULINUMTOXINA 200 UNITS IJ SOLR
155.0000 [IU] | Freq: Once | INTRAMUSCULAR | Status: AC
  Administered 2021-12-26: 155 [IU] via INTRAMUSCULAR

## 2021-12-26 NOTE — Progress Notes (Addendum)
Botox- 200 units x 1 vial Lot: [id number] Expiration: 05/2024 NDC: 1638-4665-99  Bacteriostatic 0.9% Sodium Chloride- 3mL total Lot: [id number] Expiration: 12 Oct 2022 NDC: 7939-0300-92  Dx: G43.719 S/P

## 2021-12-26 NOTE — Progress Notes (Signed)
[facility]  BOTULINUM TOXIN INJECTION PROCEDURE NOTE  Patient: Rebecca Medina   MRN: [id number]  Indication: Chronic migraines   History: 36 year old female with a history of depression, asthma, seasonal allergies who follows in clinic for chronic migraines. She has had significant improvement with her migraines since starting Botox. Only had to take Zomig 2-3 times in the past 3 months.  Last injection: 09/24/21 (Third injection today)  Technique: Informed consent was obtained and signed. 200 units onabotulinumtoxinA (Lot# [id number]; Expiration: 05/2024) were reconstituted using normal saline, to a concentration of 5 units per 0.11ml. 155 units were injected using sterile technique across 31 sites as follows: corrugator 10, procerus 5 units, frontalis 20 units, temporalis 40 units, occipitalis 30 units, cervical paraspinal 20 units, trapezius 30 units. 45 units were wasted. Patient tolerated procedure without complication.  Prior Therapies                                  Topamax 100 mg QHS - taste changes Effexor - tremors, mood changes Citalopram 40 mg daily - suicidality Botox Imitrex - lack of efficacy Maxalt - lack of efficacy Ubrelvy - lack of efficacy Zomig 5 mg PRN - helps  Plan: -Continue Zomig 5 mg PRN -Return for Botox in 3 months  Lyndon Chapel 12/26/21 11:30 AM

## 2022-01-16 ENCOUNTER — Ambulatory Visit: Payer: Managed Care, Other (non HMO) | Admitting: Internal Medicine

## 2022-01-16 ENCOUNTER — Encounter: Payer: Self-pay | Admitting: Internal Medicine

## 2022-01-16 VITALS — BP 116/72 | HR 98 | Temp 98.0°F | Ht 70.0 in | Wt 172.0 lb

## 2022-01-16 DIAGNOSIS — R7303 Prediabetes: Secondary | ICD-10-CM | POA: Diagnosis not present

## 2022-01-16 DIAGNOSIS — Z0000 Encounter for general adult medical examination without abnormal findings: Secondary | ICD-10-CM

## 2022-01-16 LAB — LIPID PANEL
Cholesterol: 216 mg/dL — ABNORMAL HIGH (ref 0–200)
HDL: 74.2 mg/dL (ref >39.00)
LDL Cholesterol: 121 mg/dL — ABNORMAL HIGH (ref 0–99)
NonHDL: 141.82
Total CHOL/HDL Ratio: 3
Triglycerides: 102 mg/dL (ref 0.0–149.0)
VLDL: 20.4 mg/dL (ref 0.0–40.0)

## 2022-01-16 LAB — BASIC METABOLIC PANEL
BUN: 17 mg/dL (ref 6–23)
CO2: 30 mEq/L (ref 19–32)
Calcium: 9.3 mg/dL (ref 8.4–10.5)
Chloride: 101 mEq/L (ref 96–112)
Creatinine, Ser: 0.84 mg/dL (ref 0.40–1.20)
GFR: 89.91 mL/min (ref >60.00)
Glucose, Bld: 88 mg/dL (ref 70–99)
Potassium: 4.3 mEq/L (ref 3.5–5.1)
Sodium: 136 mEq/L (ref 135–145)

## 2022-01-16 LAB — HEMOGLOBIN A1C: Hgb A1c MFr Bld: 5.6 % (ref 4.6–6.5)

## 2022-01-16 NOTE — Patient Instructions (Signed)

## 2022-01-16 NOTE — Progress Notes (Signed)
Subjective:  Patient ID: Rebecca Medina, female    DOB: 12/25/1985  Age: 36 y.o. MRN: [id number]  CC: Annual Exam   HPI Rebecca Medina presents for a CPX -  She has felt well recently.  She is seeing a therapist   who specializes in trauma and has made significant improvement over the last year.  She denies depression, anxiety, insomnia, SI, or HI.  Outpatient Medications Prior to Visit  Medication Sig Dispense Refill   Botulinum Toxin Type A (BOTOX) 200 units SOLR Inject 200 Units as directed every 3 (three) months. 1 each 11   buPROPion (WELLBUTRIN XL) 150 MG 24 hr tablet Take 150 mg by mouth daily.     ESTRACE 2 MG tablet      ibuprofen (ADVIL) 600 MG tablet Take 1 tablet (600 mg total) by mouth every 8 (eight) hours as needed. 30 tablet 0   Melatonin 10 MG TABS Take 10 mg by mouth at bedtime.     Multiple Vitamin (MULTIVITAMIN WITH MINERALS) TABS tablet Take 1 tablet by mouth every evening.     ondansetron (ZOFRAN) 4 MG tablet Take 1 tablet (4 mg total) by mouth every 8 (eight) hours as needed for nausea or vomiting. 20 tablet 0   zolmitriptan (ZOMIG) 5 MG tablet Take 1 tablet (5 mg total) by mouth as needed for migraine. 10 tablet 11   No facility-administered medications prior to visit.    ROS Review of Systems  Constitutional: Negative.   HENT: Negative.    Eyes: Negative.   Cardiovascular: Negative.   Gastrointestinal: Negative.   Genitourinary: Negative.   Musculoskeletal: Negative.   Neurological: Negative.  Negative for dizziness.  Hematological: Negative.   Psychiatric/Behavioral: Negative.      Objective:  BP 116/72 (BP Location: Left Arm, Patient Position: Sitting, Cuff Size: Large)   Pulse 98   Temp 98 F (36.7 C) (Oral)   Ht 5\' 10"  (1.778 m)   Wt 172 lb (78 kg)   SpO2 97%   BMI 24.68 kg/m   BP Readings from Last 3 Encounters:  01/16/22 116/72  12/26/21 113/77  12/23/21 116/74    Wt Readings from Last 3 Encounters:  01/16/22 172 lb (78 kg)  12/23/21  176 lb 6.4 oz (80 kg)  08/07/21 190 lb 6.4 oz (86.4 kg)    Physical Exam Vitals reviewed.  HENT:     Nose: Nose normal.     Mouth/Throat:     Mouth: Mucous membranes are moist.  Eyes:     General: No scleral icterus.    Conjunctiva/sclera: Conjunctivae normal.  Cardiovascular:     Rate and Rhythm: Normal rate and regular rhythm.     Heart sounds: No murmur heard. Pulmonary:     Effort: Pulmonary effort is normal.     Breath sounds: No stridor. No wheezing, rhonchi or rales.  Abdominal:     General: Abdomen is flat.     Palpations: There is no mass.     Tenderness: There is no abdominal tenderness. There is no guarding.     Hernia: No hernia is present.  Musculoskeletal:        General: Normal range of motion.     Cervical back: Neck supple.     Right lower leg: No edema.     Left lower leg: No edema.  Lymphadenopathy:     Cervical: No cervical adenopathy.  Skin:    General: Skin is warm and dry.  Neurological:     General: No  focal deficit present.     Mental Status: She is alert.  Psychiatric:        Mood and Affect: Mood normal.        Behavior: Behavior normal.     Lab Results  Component Value Date   WBC 4.8 12/24/2021   HGB 12.6 12/24/2021   HCT 37.0 12/24/2021   PLT 288 12/24/2021   GLUCOSE 88 01/16/2022   CHOL 216 (H) 01/16/2022   TRIG 102.0 01/16/2022   HDL 74.20 01/16/2022   LDLCALC 121 (H) 01/16/2022   ALT 16 12/24/2020   AST 17 12/24/2020   NA 136 01/16/2022   K 4.3 01/16/2022   CL 101 01/16/2022   CREATININE 0.84 01/16/2022   BUN 17 01/16/2022   CO2 30 01/16/2022   INR 1.1 02/29/2020   HGBA1C 5.6 01/16/2022    CT Abdomen Pelvis W Contrast  Result Date: 12/23/2021 CLINICAL DATA:  Urinary tract infection, recurrent/complicated. Acute cystitis and hematuria. Left flank tenderness. EXAM: CT ABDOMEN AND PELVIS WITH CONTRAST TECHNIQUE: Multidetector CT imaging of the abdomen and pelvis was performed using the standard protocol following bolus  administration of intravenous contrast. RADIATION DOSE REDUCTION: This exam was performed according to the departmental dose-optimization program which includes automated exposure control, adjustment of the mA and/or kV according to patient size and/or use of iterative reconstruction technique. CONTRAST:  OMNIPAQUE IOHEXOL 300 MG/ML  SOLN COMPARISON:  CT of the abdomen pelvis with contrast 12/24/2020 FINDINGS: Lower chest: The lung bases are clear without focal nodule, mass, or airspace disease. Heart size is normal. No significant pleural or pericardial effusion is present. Hepatobiliary: No focal liver abnormality is seen. No gallstones, gallbladder wall thickening, or biliary dilatation. Pancreas: Unremarkable. No pancreatic ductal dilatation or surrounding inflammatory changes. Spleen: Normal in size without focal abnormality. Adrenals/Urinary Tract: The adrenal glands are normal bilaterally. Kidneys are unremarkable. No stone or mass lesion is present. No obstruction is present. Normal parenchymal enhancement is present bilaterally. Ureters are within normal limits bilaterally. The urinary bladder is unremarkable. Stomach/Bowel: The stomach is mildly distended. Duodenum is unremarkable. Small bowel is unremarkable. Terminal ileum is within normal limits. The appendix is visualized and normal. Ascending transverse colon are within normal limits. Descending and sigmoid colon are normal. Vascular/Lymphatic: No significant vascular findings are present. No enlarged abdominal or pelvic lymph nodes. Reproductive: Status post hysterectomy. No adnexal masses. Other: No abdominal wall hernia or abnormality. No abdominopelvic ascites. Subcutaneous stranding over the posterior flanks may reflect injection sites. Trauma is considered less likely. Musculoskeletal: Vertebral body heights and alignment are normal. No focal osseous lesions are present. Bony pelvis and hips are within normal limits bilaterally.  IMPRESSION: 1. No acute or focal lesion to explain the patient's symptoms. Normal CT appearance of the kidneys, ureters and bladder. 2. Status post hysterectomy. 3. Subcutaneous stranding over the posterior flanks may reflect injection sites. Trauma is considered less likely. Electronically Signed   By: Marin Roberts M.D.   On: 12/23/2021 19:19    Assessment & Plan:   Rebecca Medina was seen today for annual exam.  Diagnoses and all orders for this visit:  Routine general medical examination at a health care facility- Exam completed, labs reviewed, she refused a flu vaccine, cancer screenings are up-to-date, patient education was given. -     Lipid panel; Future -     Lipid panel  Prediabetes -     Basic metabolic panel; Future -     Hemoglobin A1c; Future -  Hemoglobin A1c -     Basic metabolic panel   I am having Rebecca Medina maintain her Melatonin, multivitamin with minerals, Botox, buPROPion, zolmitriptan, Estrace, ibuprofen, and ondansetron.  No orders of the defined types were placed in this encounter.    Follow-up: Return in about 1 year (around 01/17/2023).  [name], MD

## 2022-01-18 ENCOUNTER — Encounter: Payer: Self-pay | Admitting: Internal Medicine

## 2022-02-26 NOTE — Telephone Encounter (Signed)
New PA is needed for botox 200U, current PA expires 03/26/22. Thank you.

## 2022-03-05 ENCOUNTER — Telehealth: Payer: Self-pay | Admitting: Psychiatry

## 2022-03-05 NOTE — Telephone Encounter (Signed)
[email or web address] has called for a new PA for pt's Botox because the current expires in 3 weeks.  Their call back # is [phone]

## 2022-03-05 NOTE — Telephone Encounter (Signed)
Please initiate new PA for botox

## 2022-03-06 ENCOUNTER — Other Ambulatory Visit (HOSPITAL_COMMUNITY): Payer: Self-pay

## 2022-03-06 NOTE — Telephone Encounter (Signed)
BOTOX ONE-Benefit Verification [id number] Submitted!

## 2022-03-06 NOTE — Telephone Encounter (Signed)
Pharmacy Patient Advocate Encounter   Received notification from [facility] that prior authorization for Botox 200UNIT solution is required/requested.    PA submitted on 03/06/2022 to (ins) [employer] via CoverMyMeds Key [id number] Status is pending

## 2022-03-10 ENCOUNTER — Other Ambulatory Visit (HOSPITAL_COMMUNITY): Payer: Self-pay
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
## 2022-03-10 NOTE — Telephone Encounter (Signed)
Pharmacy Patient Advocate Encounter  Prior Authorization for Botox 200UNIT solution has been approved.    PA# PA Case ID #: [id number] Effective dates: 03/07/2022 through 03/08/2023 Per test billing the copay is $50.00 This can be filled with [facility]

## 2022-03-10 NOTE — Telephone Encounter (Signed)
Pt has upcoming appt 03/20/22.

## 2022-03-11 ENCOUNTER — Ambulatory Visit: Payer: Managed Care, Other (non HMO) | Admitting: Family Medicine

## 2022-03-11 DIAGNOSIS — Z713 Dietary counseling and surveillance: Secondary | ICD-10-CM | POA: Diagnosis not present

## 2022-03-11 NOTE — Patient Instructions (Addendum)
Outcome goals:  Gain a sense of control over food choices.   See my body more objectively.   Choose a nutritionally sound diet.    What contributes to food choices we make:  Hunger  Habit Emotion Fatigue  Homework assignment:  Complete the meal planning form provided today, and use this as a basis for shopping, so you have ingredients for most (all) meals on hand at any given time.    Recommended behavioral goals:  1. Eat at least 3 REAL meals and 1-2 snacks per day.  Eat breakfast within one hour of getting up.  Aim for no more than 5 hours between eating.   A REAL breakfast includes at least a protein food and a starch food.   A REAL lunch and dinner includes at least some protein, some starch, and vegetables and/or fruit.  (OR: Would you serve this to a guest in your home, and call it a meal?)  - Advance planning is the key to making this happen, e.g., keeping some frozen meals on hand (or at least frozen veg's).    - Set aside a few minutes on the weekend to plan foods for the upcoming week, including a grocery list.    Document progress on the above goal using your Goals Sheet.    Follow up at 10 AM on Feb 22.

## 2022-03-13 ENCOUNTER — Other Ambulatory Visit (HOSPITAL_COMMUNITY): Payer: Self-pay

## 2022-03-13 ENCOUNTER — Other Ambulatory Visit: Payer: Self-pay

## 2022-03-13 MED ORDER — BOTOX 200 UNITS IJ SOLR
200.0000 [IU] | INTRAMUSCULAR | 11 refills | Status: DC
  Filled 2022-03-13 – 2022-06-04 (×2): qty 1, 90d supply, fill #0

## 2022-03-13 NOTE — Telephone Encounter (Signed)
Order sent.

## 2022-03-13 NOTE — Addendum Note (Signed)
Addended by: Gertie Baron D on: 03/13/2022 09:15 AM   Modules accepted: Orders

## 2022-03-13 NOTE — Telephone Encounter (Signed)
Could you send the rx to [facility]? Thank you!

## 2022-03-19 NOTE — Progress Notes (Unsigned)
[facility]  BOTULINUM TOXIN INJECTION PROCEDURE NOTE  Patient: Syria Kestner MRN: [id number]  Indication: Chronic migraines   History: 37 year old female with a history of depression, asthma, seasonal allergies who follows in clinic for chronic migraines. She has noticed an increase in lower-level headaches since getting Invisalign next month, but has had very few migraines and rarely needs to take Zomig.  Last injection: 12/26/21  Technique: Informed consent was obtained and signed. 200 units onabotulinumtoxinA (Lot# [id number]; Expiration: 07/2024) were reconstituted using normal saline, to a concentration of 5 units per 0.8ml. 155 units were injected using sterile technique across 31 sites as follows: corrugator 10, procerus 5 units, frontalis 20 units, temporalis 40 units, occipitalis 30 units, cervical paraspinal 20 units, trapezius 30 units. 45 units were wasted. Patient tolerated procedure without complication.  Prior Therapies                                  Prevention: Topamax 100 mg QHS - taste changes Effexor - tremors, mood changes Citalopram 40 mg daily - suicidality Botox  Rescue: Imitrex - lack of efficacy Maxalt - lack of efficacy Ubrelvy - lack of efficacy Zomig 5 mg PRN - helps  Plan: -Continue Zomig 5 mg PRN -Return for Botox in 3 months  [place] 03/20/22 1:32 PM

## 2022-03-20 ENCOUNTER — Ambulatory Visit: Payer: Managed Care, Other (non HMO) | Admitting: Psychiatry

## 2022-03-20 DIAGNOSIS — G43709 Chronic migraine without aura, not intractable, without status migrainosus: Secondary | ICD-10-CM

## 2022-03-20 MED ORDER — ONABOTULINUMTOXINA 200 UNITS IJ SOLR
155.0000 [IU] | Freq: Once | INTRAMUSCULAR | Status: AC
  Administered 2022-03-20: 155 [IU] via INTRAMUSCULAR

## 2022-03-20 NOTE — Progress Notes (Signed)
.  Botox- 200 units x 1 vial Lot: [id number] Expiration: 07/2024 NDC: 2481-8590-93  Bacteriostatic 0.9% Sodium Chloride- 24mL total Lot: [id number] Expiration: 11/25 NDC: 46950-722-57  Dx:G43.709 S/P

## 2022-04-02 NOTE — Progress Notes (Unsigned)
Medical Nutrition Therapy Appt start time: 1000 end time: 1100 (1 hour) Primary concerns today:  Rebecca Medina was referred by Rebecca Pall, PhD for MNT related to disordered eating.  .  Therapist Rebecca Pall, PhD, LCMHC-S, LCAS, NCC, MAC, [facility]  Relevant history/background:  History includes both restrictive and binge eating as well as purging behavior.  Experienced childhood trauma related to food insecurity; now panics if perceives no food access.  Wants to gain control over food choices and body image.  Rebecca Medina has a long history of disordered eating.  Food was used as both punishment and reward during childhood.  (She and her brother began living with foster mom when [name] was 4.)  Both [name] and her brother hoarded and binged on food regularly b/c they didn't know when they'd next have access to food.  During high school, she worried re. getting fat, so restricted intake (120 lb at h.s. graduation at height of 70".)  Menses delayed until age 51 b/c of low energy intake.  Around 2015, [name] first started binge-eating, late-night eating.  Rebecca Medina lives alone, but spends a lot of time with her boyfriend.  When staying with her boyfriend, she usually eats twice a day, but when home alone, tends to eat more frequently, consuming more sweets and fast food.  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  Currently gets botox tx every 3 months for migraine prevention; now gets migraine H/As ~2 X mo vs. daily.   Rebecca Medina would like to gain a sense of control over food choices, see her body more objectively, and choose a nutritionally sound diet.    Assessment:  Rebecca Medina ***  At initial nutrition appt, Axie's only recommended goal was to eat 3 real meals/day, and I encouraged her to complete the meal planning form to make achieving this goal easier.  She ***.  Learning Readiness: Ready  Weight: Declined weight check  today (Ht is 70".)  Usual eating pattern: ***2-3 meals and 0-3 snacks per day.  Frequent foods and beverages: Mio in water, Dr. Malachi Bonds; bagel with butter, fast food 2 X day, cereal (currently Life), 2% or whole milk.  Tends to eat one thing frequently until burning out on it.   Avoided foods: fat-free milk, meats with bones, fish, seafood, (dislikes), foreign (unfamiliar) foods.    Usual physical activity: none.  Has exercise-induced asthma.   Sleep: Estimates 2-3 to 12 hrs of broken sleep per night.  On weekends usually can't fall asleep till ***4 or 5 AM.    24-hr recall suggests intake of *** kcal:  (Up at  AM) B ( AM)-   Snk ( AM)-   L ( PM)-   Snk ( PM)-   D ( PM)-   Snk ( PM)-   Typical day? {yes [id number]   Nutritional Diagnosis: ***NB-1.5 Disordered eating pattern As related to poor appetite control.  As evidenced by erratic eating schedule including frequent LOC eating in evenings.  Handouts given during visit include: After-Visit Summary (AVS) Meal Planning Form Goals Sheet  Barriers to learning/adherence to lifestyle change: Difficult to establish a schedule with erratic and very demanding work schedule.    Monitoring/Evaluation:  Dietary intake, exercise, and body weight ***in 3 week(s).  From 1/30 *** Outcome goals:  Gain a sense of control over food choices.   See my body more objectively.   Choose a nutritionally sound diet.     What contributes  to food choices we make:  Hunger  Habit Emotion Fatigue   Homework assignment:  Complete the meal planning form provided today, and use this as a basis for shopping, so you have ingredients for most (all) meals on hand at any given time.     Behavioral goals:  1. Eat at least 3 REAL meals and 1-2 snacks per day.  Eat breakfast within one hour of getting up.  Aim for no more than 5 hours between eating.   A REAL breakfast includes at least a protein food and a starch food.   A REAL lunch and dinner includes at least  some protein, some starch, and vegetables and/or fruit.  (OR: Would you serve this to a guest in your home, and call it a meal?)             - Advance planning is the key to making this happen, e.g., keeping some frozen meals on hand (or at least frozen veg's).               - Set aside a few minutes on the weekend to plan foods for the upcoming week, including a grocery list.     Document progress on the above goal using your Goals Sheet.     Follow up on ***.

## 2022-04-03 ENCOUNTER — Ambulatory Visit: Payer: Managed Care, Other (non HMO) | Admitting: Family Medicine

## 2022-04-03 DIAGNOSIS — Z713 Dietary counseling and surveillance: Secondary | ICD-10-CM

## 2022-04-03 NOTE — Patient Instructions (Signed)
Outcome goals:  Gain a sense of control over food choices.   See my body more objectively.   Choose a nutritionally sound diet.     What contributes to food choices we make:  Hunger  Habit Emotion Fatigue   Homework assignment:  Complete the meal planning form provided today, and use this as a basis for shopping, so you have ingredients for most (all) meals on hand at any given time.     Behavioral goal:  1. Eat at least 3 REAL meals and 1-2 snacks per day.  Eat breakfast within one hour of getting up.  Aim for no more than 5 hours between eating.   A REAL breakfast includes at least a protein food and a starch food.   A REAL lunch and dinner includes at least some protein, some starch, and vegetables and/or fruit.  (OR: Would you serve this to a guest in your home, and call it a meal?) A meal should look like, taste like, and feel like a real meal, not a snack.  This means that most meals will have more than one component, and will be relatively nutritionally balanced.   When you go for several hours without eating, your are setting yourself up for overeating later.               - Advance planning is the key to making this happen, e.g., keeping some frozen meals on hand (or at least frozen veg's).               - Set aside a few minutes on the weekend to plan foods for the upcoming week, including a grocery list.     Document progress on the above goal using your Goals Sheet.     Follow up on March 21 at 1:30 PM.

## 2022-04-11 ENCOUNTER — Other Ambulatory Visit (HOSPITAL_COMMUNITY): Payer: Self-pay

## 2022-04-15 ENCOUNTER — Other Ambulatory Visit (HOSPITAL_COMMUNITY): Payer: Self-pay

## 2022-04-30 ENCOUNTER — Telehealth: Payer: Self-pay | Admitting: Psychiatry

## 2022-04-30 NOTE — Telephone Encounter (Signed)
Dr. Billey Gosling, what would you recommend?

## 2022-04-30 NOTE — Telephone Encounter (Signed)
Pt called stating that she is getting Migraines everyday. Botox is not working and the zolmitriptan (ZOMIG) 5 MG tablet works but makes her sick and can only take at night before bed. Please advise.

## 2022-05-01 ENCOUNTER — Ambulatory Visit: Payer: Managed Care, Other (non HMO) | Admitting: Family Medicine

## 2022-05-01 DIAGNOSIS — Z713 Dietary counseling and surveillance: Secondary | ICD-10-CM

## 2022-05-01 MED ORDER — EMGALITY 120 MG/ML ~~LOC~~ SOAJ: 2.0000 | Freq: Once | SUBCUTANEOUS | 0 refills | Status: AC

## 2022-05-01 MED ORDER — EMGALITY 120 MG/ML ~~LOC~~ SOAJ: 1.0000 | SUBCUTANEOUS | 6 refills | Status: DC

## 2022-05-01 MED ORDER — NARATRIPTAN HCL 2.5 MG PO TABS: 2.5000 mg | ORAL_TABLET | ORAL | 6 refills | Status: DC | PRN

## 2022-05-01 NOTE — Telephone Encounter (Signed)
Patient informed with below message.  

## 2022-05-01 NOTE — Progress Notes (Signed)
Medical Nutrition Therapy Appt start time: 1000 end time: 1100 (1 hour) Primary concerns today:  Rebecca Medina was referred by Rebecca Pall, PhD for MNT related to disordered eating.  Rebecca Medina Kitchen  PCP Rebecca Calico, MD Neurologist Rebecca Harold, MD Therapist Rebecca Pall, PhD, LCMHC-S, LCAS, Rebecca Medina, MAC, [facility]  Relevant history/background:  History includes both restrictive and binge eating as well as purging behavior.  Experienced childhood trauma related to food insecurity; now panics if perceives no food access.  Wants to gain control over food choices and body image.  Also would like to learn how to choose a nutritionally sound diet. Rebecca Medina has a long history of disordered eating.  Food was used as both punishment and reward during childhood.  She and her brother began living with foster mom when Rebecca Medina was 4.  Both of them hoarded and binged on food regularly b/c food access was unpredictable.  During high school, she worried re. getting fat, so restricted intake (120 lb at h.s. graduation at height of 70".)  Menses delayed until age 89 probably b/c of low energy intake.  Around 2015, Rebecca Medina first started binge-eating, late-night eating.  Rebecca Medina lives alone, but spends a lot of time with her boyfriend.  When staying with her boyfriend, she usually eats twice a day, but when home alone, tends to eat more frequently, consuming more sweets and fast food.  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  Currently gets botox tx every 3 months for migraine prevention; now gets migraine H/As ~2 X mo vs. daily.    Assessment:  Rebecca Medina said she is doing "better."  She has had a chronic headache for 2 weeks now, and has recently been getting migraines every day, for which Dr. Billey Medina has recommended a couple of new medications and monthly (vs. Q3 mo) botox injections.  She has "loosely" used the meal planning form.  Salad with chx is a  default meal for her.  Breakfast has been most challenging, esp when waking with a H/A.  Work has been very demanding, continuing with excessive hours, which also makes meal prepping more difficult.    24-hr recall:  (Up at 9 AM) B (11 AM)-  1 bagel, <1 tsp butter, 6 oz Dr. Malachi Medina Snk ( AM)-  --- L (3 PM)-  3 [employer] hashbrowns, 1 biscuit, 8 oz Dr. Malachi Medina Snk ( PM)-  --- D ( PM)-  1 small marinated porkchop, 1 1/2 c mashed potatoes, 1 1/4 c red cabbage&carrots, 24 oz water Snk ( PM)-  3 pieces fruit leather  Typical day? Yes.      Nutritional Diagnosis: Progress noted on NB-1.5 Disordered eating pattern As related to poor appetite control.  As evidenced by more consistent eating pattern and greater sense of control over food choices.  Handouts given during visit include: After-Visit Summary (AVS) Pdf of Urge process   Barriers to learning/adherence to lifestyle change: Difficult to establish a schedule with erratic and very demanding work schedule.    Monitoring/Evaluation:  Dietary intake and progress on behavioral goals in 3 week(s).

## 2022-05-01 NOTE — Patient Instructions (Signed)
Outcome goals:  Choose a nutritionally sound diet.   Gain a sense of control over food choices.   See my body more objectively.    Homework assignment:  1. Using the recipes you've collected, complete the meal planning form provided, and use this as a basis for shopping, so you have ingredients for most (all) meals on hand at any given time.    2. [id number] four-step process of challenging invasive thoughts:   Name it Frame it Envision it  Intentional focus shifting  Continue with behavioral goal:  1. Eat at least 3 REAL meals and 1-2 snacks per day.  Eat breakfast within one hour of getting up.  Aim for no more than 5 hours between eating.   A REAL breakfast includes at least a protein food and a starch food.   A REAL lunch and dinner includes at least some protein, some starch, and vegetables and/or fruit.  (OR: Would you serve this to a guest in your home, and call it a meal?) A meal should look like, taste like, and feel like a real meal, not a snack.  This means that most meals will have more than one component, and will be relatively nutritionally balanced.   When you go for several hours without eating, your are setting yourself up for overeating later.               - Advance planning is the key to making this happen, e.g., keeping some frozen meals on hand (or at least frozen veg's).               - Set aside a few minutes on the weekend to plan foods for the upcoming week, including a grocery list.     Document progress on the above goal using your Goals Sheet.     Follow up on Tuesday, April 9 at 4 PM.

## 2022-05-01 NOTE — Telephone Encounter (Signed)
We can try switching Botox to a monthly injectable. I'll send an rx for Emgality to her pharmacy. I'll also send a prescription for naratriptan to try for rescue. This tends to have fewer side effects than the zolmitriptan does

## 2022-05-20 ENCOUNTER — Ambulatory Visit: Payer: Managed Care, Other (non HMO) | Admitting: Family Medicine

## 2022-05-29 ENCOUNTER — Ambulatory Visit: Payer: Managed Care, Other (non HMO) | Admitting: Family Medicine

## 2022-06-02 ENCOUNTER — Other Ambulatory Visit: Payer: Self-pay

## 2022-06-03 ENCOUNTER — Other Ambulatory Visit (HOSPITAL_COMMUNITY): Payer: Self-pay

## 2022-06-04 ENCOUNTER — Other Ambulatory Visit: Payer: Self-pay

## 2022-06-04 ENCOUNTER — Ambulatory Visit: Payer: Managed Care, Other (non HMO) | Admitting: Family Medicine

## 2022-06-04 ENCOUNTER — Other Ambulatory Visit (HOSPITAL_COMMUNITY): Payer: Self-pay

## 2022-06-04 NOTE — Progress Notes (Deleted)
Medical Nutrition Therapy Appt start time: 1000 end time: 1100 (1 hour) Primary concerns today:  [name] was referred by [name], PhD for MNT related to disordered eating.  [name]  PCP [name], MD Neurologist [name], MD Therapist [name], PhD, LCMHC-S, LCAS, NCC, MAC, [facility]  Relevant history/background:  History includes both restrictive and binge eating as well as purging behavior.  Experienced childhood trauma related to food insecurity; now panics if perceives no food access.  Wants to gain control over food choices and body image.  Also would like to learn how to choose a nutritionally sound diet. Rebecca Medina has a long history of disordered eating.  Food was used as both punishment and reward during childhood.  She and her brother began living with foster mom when Rebecca Medina was 4.  Both of them hoarded and binged on food regularly b/c food access was unpredictable.  During high school, she worried re. getting fat, so restricted intake (120 lb at h.s. graduation at height of 70".)  Menses delayed until age 43 probably b/c of low energy intake.  Around 2015, Rebecca Medina first started binge-eating, late-night eating.  Rebecca Medina lives alone, but spends a lot of time with her boyfriend.  When staying with her boyfriend, she usually eats twice a day, but when home alone, tends to eat more frequently, consuming more sweets and fast food.  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  Currently gets botox tx every 3 months for migraine prevention; now gets migraine H/As ~2 X mo vs. daily.    Assessment:  [name] ***.  24-hr recall suggests intake of *** kcal:  (Up at  AM) B ( AM)-   Snk ( AM)-   L ( PM)-   Snk ( PM)-   D ( PM)-   Snk ( PM)-   Typical day? {yes [id number]   Nutritional Diagnosis: ***Progress noted on NB-1.5 Disordered eating pattern As related to poor appetite control.  As  evidenced by more consistent eating pattern and greater sense of control over food choices.  Handouts given during visit include: After-Visit Summary (AVS)  Barriers to learning/adherence to lifestyle change: Difficult to establish a schedule with erratic and very demanding work schedule.    Monitoring/Evaluation:  Dietary intake and progress on behavioral goals ***in 3 week(s).  From 3/21 ***   Homework assignment:  1. Using the recipes you've collected, complete the meal planning form provided, and use this as a basis for shopping, so you have ingredients for most (all) meals on hand at any given time.     2. [id number] four-step process of challenging invasive thoughts:   Name it Frame it Envision it  Intentional focus shifting   Behavioral goal:  1. Eat at least 3 REAL meals and 1-2 snacks per day.  Eat breakfast within one hour of getting up.  Aim for no more than 5 hours between eating.   A REAL breakfast includes at least a protein food and a starch food.   A REAL lunch and dinner includes at least some protein, some starch, and vegetables and/or fruit.  (OR: Would you serve this to a guest in your home, and call it a meal?) A meal should look like, taste like, and feel like a real meal, not a snack.  This means that most meals will have more than one component, and will be relatively nutritionally balanced.   When you go for  several hours without eating, your are setting yourself up for overeating later.               - Advance planning is the key to making this happen, e.g., keeping some frozen meals on hand (or at least frozen veg's).               - Set aside a few minutes on the weekend to plan foods for the upcoming week, including a grocery list.     Document progress on the above goal using your Goals Sheet.     Follow up on ***.

## 2022-06-05 ENCOUNTER — Other Ambulatory Visit: Payer: Self-pay

## 2022-06-06 ENCOUNTER — Other Ambulatory Visit (HOSPITAL_COMMUNITY): Payer: Self-pay

## 2022-06-10 ENCOUNTER — Other Ambulatory Visit (HOSPITAL_COMMUNITY): Payer: Self-pay

## 2022-06-10 ENCOUNTER — Telehealth: Payer: Self-pay

## 2022-06-10 NOTE — Telephone Encounter (Signed)
Patient Advocate Encounter   Received notification from [employer] that prior authorization is required for Emgality 120MG /ML auto-injectors (migraine)   Submitted: 06-10-2022 Key [id number]  Status is pending

## 2022-06-11 ENCOUNTER — Other Ambulatory Visit (HOSPITAL_COMMUNITY): Payer: Self-pay

## 2022-06-16 ENCOUNTER — Ambulatory Visit: Payer: Managed Care, Other (non HMO) | Admitting: Adult Health

## 2022-06-16 DIAGNOSIS — G43709 Chronic migraine without aura, not intractable, without status migrainosus: Secondary | ICD-10-CM | POA: Diagnosis not present

## 2022-06-16 MED ORDER — UBRELVY 100 MG PO TABS: ORAL_TABLET | ORAL | 11 refills | Status: DC

## 2022-06-16 MED ORDER — EMGALITY 120 MG/ML ~~LOC~~ SOAJ: SUBCUTANEOUS | 11 refills | Status: DC

## 2022-06-16 MED ORDER — ONABOTULINUMTOXINA 200 UNITS IJ SOLR: 155.0000 [IU] | Freq: Once | INTRAMUSCULAR | Status: AC

## 2022-06-16 NOTE — Progress Notes (Deleted)
Botox- 200 units x 1 vial Lot: [id number] Expiration: 07/2024 NDC: 3086-5784-69  Bacteriostatic 0.9% Sodium Chloride- * mL  Lot: [id number] Expiration: 12/2023 NDC: 13244-010-27  Dx: O53.664  S/P Witnessed by Delmer Islam

## 2022-06-16 NOTE — Patient Instructions (Addendum)
Your Plan:  Start Emgality- Loading dose of 240 mg for the first month and then 120 mg monthly thereafter Retry Ubrelvy 100 mg for abortive therapy. Take at the onset of migraine and repeat in 2 hours if needed.  Ok to take Amerge if needed. Avoid taking [name] and Amerge together Avoid taking over-the-counter medications on a daily basis as this can cause a rebound headache FU in 4 months or sooner if needed   Thank you for coming to see [place] at [facility]. I hope we have been able to provide you high quality care today.  You may receive a patient satisfaction survey over the next few weeks. We would appreciate your feedback and comments so that we may continue to improve ourselves and the health of our patients.
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
## 2022-06-16 NOTE — Progress Notes (Deleted)
06/16/22:   History: 37 year old female with a history of depression, asthma, seasonal allergies who follows in clinic for chronic migraines. She has noticed an increase in lower-level headaches since getting Invisalign next month, but has had very few migraines and rarely needs to take Zomig.    BOTOX PROCEDURE NOTE FOR MIGRAINE HEADACHE    Contraindications and precautions discussed with patient(above). Aseptic procedure was observed and patient tolerated procedure. Procedure performed by Butch Penny, NP  The condition has existed for more than 6 months, and pt does not have a diagnosis of ALS, Myasthenia Gravis or Lambert-Eaton Syndrome.  Risks and benefits of injections discussed and pt agrees to proceed with the procedure.  Written consent obtained  These injections are medically necessary. He receives good benefits from these injections***. These injections do not cause sedations or hallucinations which the oral therapies may cause.  Indication/Diagnosis: chronic migraine BOTOX(J0585) injection was performed according to protocol by [employer]. 200 units of BOTOX was dissolved into 4 cc NS.   NDC: [address]-40  Type of toxin: Botox  Lot # *** EXP: ***   Description of procedure:  The patient was placed in a sitting position. The standard protocol was used for Botox as follows, with 5 units of Botox injected at each site:   -Procerus muscle, midline injection  -Corrugator muscle, bilateral injection  -Frontalis muscle, bilateral injection, with 2 sites each side, medial injection was performed in the upper one third of the frontalis muscle, in the region vertical from the medial inferior edge of the superior orbital rim. The lateral injection was again in the upper one third of the forehead vertically above the lateral limbus of the cornea, 1.5 cm lateral to the medial injection site.  -Temporalis muscle injection, 4 sites, bilaterally. The first injection was 3 cm above  the tragus of the ear, second injection site was 1.5 cm to 3 cm up from the first injection site in line with the tragus of the ear. The third injection site was 1.5-3 cm forward between the first 2 injection sites. The fourth injection site was 1.5 cm posterior to the second injection site.  -Occipitalis muscle injection, 3 sites, bilaterally. The first injection was done one half way between the occipital protuberance and the tip of the mastoid process behind the ear. The second injection site was done lateral and superior to the first, 1 fingerbreadth from the first injection. The third injection site was 1 fingerbreadth superiorly and medially from the first injection site.  -Cervical paraspinal muscle injection, 2 sites, bilateral knee first injection site was 1 cm from the midline of the cervical spine, 3 cm inferior to the lower border of the occipital protuberance. The second injection site was 1.5 cm superiorly and laterally to the first injection site.  -Trapezius muscle injection was performed at 3 sites, bilaterally. The first injection site was in the upper trapezius muscle halfway between the inflection point of the neck, and the acromion. The second injection site was one half way between the acromion and the first injection site. The third injection was done between the first injection site and the inflection point of the neck.   Will return for repeat injection in 3 months.   A 200 unit sof Botox was used, 155 units were injected, the rest of the Botox was wasted. The patient tolerated the procedure well, there were no complications of the above procedure.  Butch Penny, MSN, NP-C 06/16/2022, 3:10 PM [facility] [address],  [place], [place] [address] [phone]

## 2022-06-17 ENCOUNTER — Telehealth: Payer: Self-pay | Admitting: *Deleted

## 2022-06-19 ENCOUNTER — Other Ambulatory Visit (HOSPITAL_COMMUNITY): Payer: Self-pay

## 2022-06-20 ENCOUNTER — Telehealth: Payer: Self-pay

## 2022-06-20 ENCOUNTER — Other Ambulatory Visit (HOSPITAL_COMMUNITY): Payer: Self-pay

## 2022-06-20 NOTE — Telephone Encounter (Signed)
A new telephone call encounter has been made for this PA Request-please see telephone note dated 06/20/2022. 

## 2022-06-20 NOTE — Telephone Encounter (Signed)
Pharmacy Patient Advocate Encounter  Prior Authorization for [name] 100MG  tablets has been approved by [employer] (ins).    PA # PA Case ID: [id number] Effective dates: 06/20/2022 through 06/20/2023.

## 2022-07-08 ENCOUNTER — Ambulatory Visit: Payer: Managed Care, Other (non HMO) | Admitting: Internal Medicine

## 2022-07-23 ENCOUNTER — Ambulatory Visit: Payer: Managed Care, Other (non HMO) | Admitting: Internal Medicine

## 2022-07-23 ENCOUNTER — Encounter: Payer: Self-pay | Admitting: Internal Medicine

## 2022-07-23 VITALS — BP 124/72 | HR 80 | Temp 97.9°F | Resp 16 | Ht 70.0 in | Wt 169.0 lb

## 2022-07-23 DIAGNOSIS — R2232 Localized swelling, mass and lump, left upper limb: Secondary | ICD-10-CM | POA: Diagnosis not present

## 2022-07-25 ENCOUNTER — Encounter: Payer: Self-pay | Admitting: Internal Medicine

## 2022-07-25 DIAGNOSIS — R2232 Localized swelling, mass and lump, left upper limb: Secondary | ICD-10-CM | POA: Insufficient documentation

## 2022-07-25 NOTE — Progress Notes (Unsigned)
Subjective:  Patient ID: Rebecca Medina, female    DOB: Mar 31, 1985  Age: 37 y.o. MRN: [id number]  CC: No chief complaint on file.   HPI Rebecca Medina presents for f/up -  She complains of a several month history of masses under the skin in both arms.  Outpatient Medications Prior to Visit  Medication Sig Dispense Refill   buPROPion (WELLBUTRIN XL) 150 MG 24 hr tablet Take 150 mg by mouth daily.     ESTRACE 2 MG tablet      Galcanezumab-gnlm (EMGALITY) 120 MG/ML SOAJ Inject loading dose of 240 mg for first month and then 120 mg every 30 days therafter 1.12 mL 11   naratriptan (AMERGE) 2.5 MG tablet Take 1 tablet (2.5 mg total) by mouth as needed. Take one (1) tablet at onset of headache; if returns or does not resolve, may repeat after 4 hours; do not exceed five (5) mg in 24 hours. 10 tablet 6   Ubrogepant (UBRELVY) 100 MG TABS Take 1 tablet at the onset of migraine. Can repeat in 2 hours if needed. 10 tablet 11   Facility-Administered Medications Prior to Visit  Medication Dose Route Frequency Provider Last Rate Last Admin   botulinum toxin Type A (BOTOX) injection 155 Units  155 Units Intramuscular Once Butch Penny, NP        ROS Review of Systems  Objective:  BP 124/72 (BP Location: Right Arm, Patient Position: Sitting, Cuff Size: Large)   Pulse (!) 103   Temp 97.9 F (36.6 C) (Oral)   Ht 5\' 10"  (1.778 m)   Wt 169 lb (76.7 kg)   SpO2 97%   BMI 24.25 kg/m   BP Readings from Last 3 Encounters:  07/23/22 124/72  01/16/22 116/72  12/26/21 113/77    Wt Readings from Last 3 Encounters:  07/23/22 169 lb (76.7 kg)  01/16/22 172 lb (78 kg)  12/23/21 176 lb 6.4 oz (80 kg)    Physical Exam  Lab Results  Component Value Date   WBC 4.8 12/24/2021   HGB 12.6 12/24/2021   HCT 37.0 12/24/2021   PLT 288 12/24/2021   GLUCOSE 88 01/16/2022   CHOL 216 (H) 01/16/2022   TRIG 102.0 01/16/2022   HDL 74.20 01/16/2022   LDLCALC 121 (H) 01/16/2022   ALT 16 12/24/2020   AST 17  12/24/2020   NA 136 01/16/2022   K 4.3 01/16/2022   CL 101 01/16/2022   CREATININE 0.84 01/16/2022   BUN 17 01/16/2022   CO2 30 01/16/2022   INR 1.1 02/29/2020   HGBA1C 5.6 01/16/2022    CT Abdomen Pelvis W Contrast  Result Date: 12/23/2021 CLINICAL DATA:  Urinary tract infection, recurrent/complicated. Acute cystitis and hematuria. Left flank tenderness. EXAM: CT ABDOMEN AND PELVIS WITH CONTRAST TECHNIQUE: Multidetector CT imaging of the abdomen and pelvis was performed using the standard protocol following bolus administration of intravenous contrast. RADIATION DOSE REDUCTION: This exam was performed according to the departmental dose-optimization program which includes automated exposure control, adjustment of the mA and/or kV according to patient size and/or use of iterative reconstruction technique. CONTRAST:  OMNIPAQUE IOHEXOL 300 MG/ML  SOLN COMPARISON:  CT of the abdomen pelvis with contrast 12/24/2020 FINDINGS: Lower chest: The lung bases are clear without focal nodule, mass, or airspace disease. Heart size is normal. No significant pleural or pericardial effusion is present. Hepatobiliary: No focal liver abnormality is seen. No gallstones, gallbladder wall thickening, or biliary dilatation. Pancreas: Unremarkable. No pancreatic ductal dilatation or surrounding inflammatory  changes. Spleen: Normal in size without focal abnormality. Adrenals/Urinary Tract: The adrenal glands are normal bilaterally. Kidneys are unremarkable. No stone or mass lesion is present. No obstruction is present. Normal parenchymal enhancement is present bilaterally. Ureters are within normal limits bilaterally. The urinary bladder is unremarkable. Stomach/Bowel: The stomach is mildly distended. Duodenum is unremarkable. Small bowel is unremarkable. Terminal ileum is within normal limits. The appendix is visualized and normal. Ascending transverse colon are within normal limits. Descending and sigmoid colon are  normal. Vascular/Lymphatic: No significant vascular findings are present. No enlarged abdominal or pelvic lymph nodes. Reproductive: Status post hysterectomy. No adnexal masses. Other: No abdominal wall hernia or abnormality. No abdominopelvic ascites. Subcutaneous stranding over the posterior flanks may reflect injection sites. Trauma is considered less likely. Musculoskeletal: Vertebral body heights and alignment are normal. No focal osseous lesions are present. Bony pelvis and hips are within normal limits bilaterally. IMPRESSION: 1. No acute or focal lesion to explain the patient's symptoms. Normal CT appearance of the kidneys, ureters and bladder. 2. Status post hysterectomy. 3. Subcutaneous stranding over the posterior flanks may reflect injection sites. Trauma is considered less likely. Electronically Signed   By: Marin Roberts M.D.   On: 12/23/2021 19:19    Assessment & Plan:  Subcutaneous mass of left upper extremity -     MR HUMERUS LEFT WO CONTRAST; Future     Follow-up: No follow-ups on file.  [name], MD

## 2022-08-05 ENCOUNTER — Other Ambulatory Visit: Payer: Managed Care, Other (non HMO)

## 2022-08-07 NOTE — Telephone Encounter (Signed)
Insurance denied MRI request - Please let patient know what next steps  are.

## 2022-08-18 NOTE — Telephone Encounter (Signed)
Patient would like to know if the order for the x-ray can be put in. She would like a call back at [phone] when that is done so she can get it.

## 2022-08-26 ENCOUNTER — Other Ambulatory Visit: Payer: Self-pay | Admitting: Internal Medicine

## 2022-08-26 DIAGNOSIS — R2232 Localized swelling, mass and lump, left upper limb: Secondary | ICD-10-CM

## 2022-08-26 NOTE — Telephone Encounter (Signed)
Pt called back about xray pt has 2 more lumps on her arm near the old one. Please advise.

## 2022-08-27 ENCOUNTER — Other Ambulatory Visit (HOSPITAL_COMMUNITY): Payer: Self-pay

## 2022-09-01 NOTE — Telephone Encounter (Signed)
Patient called back and would like to know when this is going to be scheduled.

## 2022-09-04 ENCOUNTER — Other Ambulatory Visit: Payer: Self-pay | Admitting: Internal Medicine

## 2022-09-04 ENCOUNTER — Ambulatory Visit: Payer: Managed Care, Other (non HMO)

## 2022-09-04 DIAGNOSIS — R2232 Localized swelling, mass and lump, left upper limb: Secondary | ICD-10-CM

## 2022-09-11 ENCOUNTER — Ambulatory Visit: Payer: Managed Care, Other (non HMO) | Admitting: Adult Health

## 2022-09-11 ENCOUNTER — Encounter: Payer: Self-pay | Admitting: Internal Medicine

## 2022-09-11 ENCOUNTER — Telehealth: Payer: Self-pay

## 2022-09-11 ENCOUNTER — Other Ambulatory Visit: Payer: Self-pay | Admitting: Internal Medicine

## 2022-09-11 ENCOUNTER — Other Ambulatory Visit (HOSPITAL_COMMUNITY): Payer: Self-pay

## 2022-09-11 DIAGNOSIS — R2232 Localized swelling, mass and lump, left upper limb: Secondary | ICD-10-CM | POA: Insufficient documentation

## 2022-09-11 NOTE — Telephone Encounter (Signed)
   PT has not been evaluated since starting Emgality-I received a PA renewal-Please advise

## 2022-09-16 ENCOUNTER — Ambulatory Visit: Payer: Managed Care, Other (non HMO) | Admitting: Orthopaedic Surgery

## 2022-09-16 DIAGNOSIS — D1722 Benign lipomatous neoplasm of skin and subcutaneous tissue of left arm: Secondary | ICD-10-CM | POA: Diagnosis not present
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
## 2022-10-20 ENCOUNTER — Other Ambulatory Visit: Payer: Self-pay | Admitting: Physician Assistant

## 2022-10-20 MED ORDER — HYDROCODONE-ACETAMINOPHEN 5-325 MG PO TABS: 1.0000 | ORAL_TABLET | Freq: Three times a day (TID) | ORAL | 0 refills | Status: DC | PRN

## 2022-10-20 MED ORDER — ONDANSETRON HCL 4 MG PO TABS: 4.0000 mg | ORAL_TABLET | Freq: Three times a day (TID) | ORAL | 0 refills | Status: DC | PRN

## 2022-10-23 ENCOUNTER — Ambulatory Visit: Payer: Managed Care, Other (non HMO) | Admitting: Adult Health

## 2022-10-23 DIAGNOSIS — D1722 Benign lipomatous neoplasm of skin and subcutaneous tissue of left arm: Secondary | ICD-10-CM | POA: Diagnosis not present

## 2022-11-04 ENCOUNTER — Telehealth: Payer: Self-pay | Admitting: Orthopaedic Surgery

## 2022-11-04 NOTE — Telephone Encounter (Signed)
Pt called requesting a call from Dr Roda Shutters. Pt states she had an biopsy done and haven't gotten results. Please call pt at [phone].

## 2022-11-04 NOTE — Telephone Encounter (Signed)
Notified patient she would get results Thursday.

## 2022-11-04 NOTE — Telephone Encounter (Signed)
I will know on Thursday when I go back to the surgical center.

## 2022-11-06 ENCOUNTER — Encounter: Payer: Self-pay | Admitting: Physician Assistant

## 2022-11-06 ENCOUNTER — Ambulatory Visit (INDEPENDENT_AMBULATORY_CARE_PROVIDER_SITE_OTHER): Payer: Managed Care, Other (non HMO) | Admitting: Physician Assistant

## 2022-11-06 DIAGNOSIS — R2232 Localized swelling, mass and lump, left upper limb: Secondary | ICD-10-CM
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
## 2022-11-12 ENCOUNTER — Telehealth: Payer: Self-pay | Admitting: Orthopaedic Surgery

## 2022-11-12 NOTE — Telephone Encounter (Signed)
Patient called and wants to know her results for the biopsy. CB#[phone]

## 2022-11-13 NOTE — Telephone Encounter (Signed)
Notified patient.

## 2022-11-13 NOTE — Telephone Encounter (Signed)
It showed they were benign lipomas.

## 2023-01-19 ENCOUNTER — Encounter: Payer: Self-pay | Admitting: Internal Medicine

## 2023-01-19 ENCOUNTER — Ambulatory Visit (INDEPENDENT_AMBULATORY_CARE_PROVIDER_SITE_OTHER): Payer: Managed Care, Other (non HMO) | Admitting: Internal Medicine

## 2023-01-19 VITALS — BP 130/74 | HR 77 | Temp 97.8°F | Resp 16 | Ht 70.0 in | Wt 176.2 lb

## 2023-01-19 DIAGNOSIS — Z0001 Encounter for general adult medical examination with abnormal findings: Secondary | ICD-10-CM

## 2023-01-19 DIAGNOSIS — F411 Generalized anxiety disorder: Secondary | ICD-10-CM | POA: Diagnosis not present

## 2023-01-19 DIAGNOSIS — Z0000 Encounter for general adult medical examination without abnormal findings: Secondary | ICD-10-CM

## 2023-01-19 DIAGNOSIS — J0100 Acute maxillary sinusitis, unspecified: Secondary | ICD-10-CM | POA: Diagnosis not present

## 2023-01-19 MED ORDER — AMOXICILLIN-POT CLAVULANATE 875-125 MG PO TABS
1.0000 | ORAL_TABLET | Freq: Two times a day (BID) | ORAL | 0 refills | Status: AC
Start: 2023-01-19 — End: 2023-01-26

## 2023-01-19 NOTE — Progress Notes (Unsigned)
Subjective:  Patient ID: Rebecca Medina, female    DOB: 07/05/1985  Age: 37 y.o. MRN: [id number]  CC: Depression, Annual Exam, and Sinusitis   HPI Rebecca Medina presents for a CPX and f/up ----  Discussed the use of AI scribe software for clinical note transcription with the patient, who gave verbal consent to proceed.  History of Present Illness   The patient presents with a month-long history of sinus-related issues, characterized by intense pressure in the facial region, frequent runny nose, occasional nosebleeds, and facial pain. The discomfort radiates to the teeth. The nasal discharge varies in color, ranging from clear to brown (old blood) to yellow and green, suggestive of an infection. However, the patient reports inconsistency in these symptoms. She also reports intermittent sore throat and night sweats, but denies any fever or chills.  The onset of these symptoms was preceded by what the patient believed to be an ear infection, although she has a history of TMJ problems. She has attempted to manage the symptoms with sinus congestion medication, cold medicine, and various allergy medications, including Allegra, with limited success. The patient is open to trying antibiotics to alleviate the symptoms.  The patient is currently on a temporary leave of absence from work due to significant stress and management changes. She reports a significant increase in anxiety and panic attacks, leading to physical symptoms such as numbness in the roof of the mouth and difficulty breathing. She denies suicidal ideation but expresses a desire for escape from her current situation.  The patient has a history of taking various antidepressants, including Wellbutrin, Effexor, Celexa, and Prozac, with mixed results. She is currently seeing a psychiatrist and is considering new medication options, although she expresses concern about potential side effects and dependency. She has previously taken Xanax for anxiety  but found it made her tired. She is open to considering similar medication to manage her current anxiety levels.       Outpatient Medications Prior to Visit  Medication Sig Dispense Refill   buPROPion (WELLBUTRIN XL) 150 MG 24 hr tablet Take 150 mg by mouth daily.     estradiol (VIVELLE-DOT) 0.05 MG/24HR patch Place 1 patch onto the skin 2 (two) times a week.     Fexofenadine HCl (ALLEGRA PO) Take by mouth daily.     progesterone (PROMETRIUM) 100 MG capsule Take 100 mg by mouth daily.     ESTRACE 2 MG tablet      Facility-Administered Medications Prior to Visit  Medication Dose Route Frequency Provider Last Rate Last Admin   botulinum toxin Type A (BOTOX) injection 155 Units  155 Units Intramuscular Once Butch Penny, NP        ROS Review of Systems  Constitutional: Negative.   Psychiatric/Behavioral:  Positive for dysphoric mood and sleep disturbance. Negative for behavioral problems, confusion, decreased concentration, self-injury and suicidal ideas. The patient is nervous/anxious.     Objective:  BP 130/74 (BP Location: Left Arm, Patient Position: Sitting, Cuff Size: Normal)   Pulse 77   Temp 97.8 F (36.6 C) (Oral)   Ht 5\' 10"  (1.778 m)   Wt 176 lb 3.2 oz (79.9 kg)   SpO2 97%   BMI 25.28 kg/m   BP Readings from Last 3 Encounters:  01/19/23 130/74  07/23/22 124/72  01/16/22 116/72    Wt Readings from Last 3 Encounters:  01/19/23 176 lb 3.2 oz (79.9 kg)  07/23/22 169 lb (76.7 kg)  01/16/22 172 lb (78 kg)    Physical Exam  Vitals reviewed.  Constitutional:      General: She is not in acute distress.    Appearance: She is ill-appearing. She is not toxic-appearing or diaphoretic.  HENT:     Right Ear: Hearing, tympanic membrane, ear canal and external ear normal.     Left Ear: Hearing, tympanic membrane, ear canal and external ear normal.     Nose: Rhinorrhea present. No congestion. Rhinorrhea is purulent.     Right Nostril: No epistaxis.     Left Nostril: No  epistaxis.     Right Sinus: No maxillary sinus tenderness or frontal sinus tenderness.     Left Sinus: No maxillary sinus tenderness or frontal sinus tenderness.  Eyes:     General: No scleral icterus.    Conjunctiva/sclera: Conjunctivae normal.  Cardiovascular:     Rate and Rhythm: Normal rate and regular rhythm.     Heart sounds: No murmur heard.    No friction rub. No gallop.  Pulmonary:     Effort: Pulmonary effort is normal.     Breath sounds: No stridor. No wheezing, rhonchi or rales.  Abdominal:     General: Abdomen is flat.     Palpations: There is no mass.     Tenderness: There is no abdominal tenderness. There is no guarding.     Hernia: No hernia is present.  Musculoskeletal:        General: Normal range of motion.     Cervical back: Neck supple.     Right lower leg: No edema.     Left lower leg: No edema.  Skin:    General: Skin is warm and dry.  Neurological:     General: No focal deficit present.     Mental Status: She is alert. Mental status is at baseline.  Psychiatric:        Attention and Perception: Attention and perception normal.        Mood and Affect: Mood is anxious and depressed. Mood is not elated. Affect is tearful. Affect is not labile, blunt, flat or angry.        Speech: Speech normal.        Behavior: Behavior normal. Behavior is cooperative.        Thought Content: Thought content normal. Thought content is not paranoid or delusional. Thought content does not include homicidal or suicidal ideation.        Cognition and Memory: Cognition normal.        Judgment: Judgment normal.     Lab Results  Component Value Date   WBC 4.8 12/24/2021   HGB 12.6 12/24/2021   HCT 37.0 12/24/2021   PLT 288 12/24/2021   GLUCOSE 88 01/16/2022   CHOL 216 (H) 01/16/2022   TRIG 102.0 01/16/2022   HDL 74.20 01/16/2022   LDLCALC 121 (H) 01/16/2022   ALT 16 12/24/2020   AST 17 12/24/2020   NA 136 01/16/2022   K 4.3 01/16/2022   CL 101 01/16/2022    CREATININE 0.84 01/16/2022   BUN 17 01/16/2022   CO2 30 01/16/2022   INR 1.1 02/29/2020   HGBA1C 5.6 01/16/2022    CT Abdomen Pelvis W Contrast  Result Date: 12/23/2021 CLINICAL DATA:  Urinary tract infection, recurrent/complicated. Acute cystitis and hematuria. Left flank tenderness. EXAM: CT ABDOMEN AND PELVIS WITH CONTRAST TECHNIQUE: Multidetector CT imaging of the abdomen and pelvis was performed using the standard protocol following bolus administration of intravenous contrast. RADIATION DOSE REDUCTION: This exam was performed according to the departmental dose-optimization  program which includes automated exposure control, adjustment of the mA and/or kV according to patient size and/or use of iterative reconstruction technique. CONTRAST:  OMNIPAQUE IOHEXOL 300 MG/ML  SOLN COMPARISON:  CT of the abdomen pelvis with contrast 12/24/2020 FINDINGS: Lower chest: The lung bases are clear without focal nodule, mass, or airspace disease. Heart size is normal. No significant pleural or pericardial effusion is present. Hepatobiliary: No focal liver abnormality is seen. No gallstones, gallbladder wall thickening, or biliary dilatation. Pancreas: Unremarkable. No pancreatic ductal dilatation or surrounding inflammatory changes. Spleen: Normal in size without focal abnormality. Adrenals/Urinary Tract: The adrenal glands are normal bilaterally. Kidneys are unremarkable. No stone or mass lesion is present. No obstruction is present. Normal parenchymal enhancement is present bilaterally. Ureters are within normal limits bilaterally. The urinary bladder is unremarkable. Stomach/Bowel: The stomach is mildly distended. Duodenum is unremarkable. Small bowel is unremarkable. Terminal ileum is within normal limits. The appendix is visualized and normal. Ascending transverse colon are within normal limits. Descending and sigmoid colon are normal. Vascular/Lymphatic: No significant vascular findings are present. No  enlarged abdominal or pelvic lymph nodes. Reproductive: Status post hysterectomy. No adnexal masses. Other: No abdominal wall hernia or abnormality. No abdominopelvic ascites. Subcutaneous stranding over the posterior flanks may reflect injection sites. Trauma is considered less likely. Musculoskeletal: Vertebral body heights and alignment are normal. No focal osseous lesions are present. Bony pelvis and hips are within normal limits bilaterally. IMPRESSION: 1. No acute or focal lesion to explain the patient's symptoms. Normal CT appearance of the kidneys, ureters and bladder. 2. Status post hysterectomy. 3. Subcutaneous stranding over the posterior flanks may reflect injection sites. Trauma is considered less likely. Electronically Signed   By: Marin Roberts M.D.   On: 12/23/2021 19:19    Assessment & Plan:  Encounter for general adult medical examination with abnormal findings  Acute non-recurrent maxillary sinusitis -     Amoxicillin-Pot Clavulanate; Take 1 tablet by mouth 2 (two) times daily for 7 days.  Dispense: 14 tablet; Refill: 0     Follow-up: Return in about 6 months (around 07/20/2023).  [name], MD

## 2023-01-19 NOTE — Patient Instructions (Signed)

## 2023-01-20 ENCOUNTER — Encounter: Payer: Self-pay | Admitting: Internal Medicine

## 2023-01-20 MED ORDER — HYDROXYZINE HCL 10 MG PO TABS: 10.0000 mg | ORAL_TABLET | Freq: Three times a day (TID) | ORAL | 0 refills | Status: DC | PRN

## 2023-02-25 ENCOUNTER — Other Ambulatory Visit: Payer: Self-pay | Admitting: Medical Genetics

## 2023-03-05 ENCOUNTER — Ambulatory Visit: Payer: Managed Care, Other (non HMO) | Admitting: Family Medicine

## 2023-03-05 ENCOUNTER — Telehealth: Payer: Self-pay | Admitting: Internal Medicine

## 2023-03-05 VITALS — BP 102/70 | HR 85 | Temp 97.8°F | Ht 70.0 in | Wt 170.0 lb

## 2023-03-05 DIAGNOSIS — H9212 Otorrhea, left ear: Secondary | ICD-10-CM

## 2023-03-05 DIAGNOSIS — H60502 Unspecified acute noninfective otitis externa, left ear: Secondary | ICD-10-CM | POA: Diagnosis not present

## 2023-03-05 DIAGNOSIS — H9202 Otalgia, left ear: Secondary | ICD-10-CM | POA: Diagnosis not present

## 2023-03-05 MED ORDER — CIPROFLOXACIN-DEXAMETHASONE 0.3-0.1 % OT SUSP: 4.0000 [drp] | Freq: Two times a day (BID) | OTIC | 0 refills | Status: DC

## 2023-03-05 MED ORDER — AMOXICILLIN-POT CLAVULANATE 875-125 MG PO TABS: 1.0000 | ORAL_TABLET | Freq: Two times a day (BID) | ORAL | 0 refills | Status: DC

## 2023-03-05 NOTE — Progress Notes (Signed)
Subjective:  Rebecca Medina is a 38 y.o. female who presents for a 1 wk hx of left ear pain, drainage and fullness. No fever, chills, headache, ST, cough, N/V.   ROS as in subjective.   Objective: Vitals:   03/05/23 1505  BP: 102/70  Pulse: 85  Temp: 97.8 F (36.6 C)  SpO2: 100%    General appearance: Alert, WD/WN, no distress                             Skin: warm, no rash                           Head: no sinus tenderness                            Eyes: conjunctiva normal, corneas clear, PERRLA                            Ears: left ear canal with edema, tenderness and yellowish- brown crusting. Unable to visualize TM on left. Right TM and canal normal.                           Nose: septum midline, no drainage             Mouth/throat: MMM, tongue normal, mild pharyngeal erythema                           Neck: supple, no adenopathy, no thyromegaly, nontender                        Assessment: Acute otitis externa of left ear, unspecified type - Plan: ciprofloxacin-dexamethasone (CIPRODEX) OTIC suspension, amoxicillin-clavulanate (AUGMENTIN) 875-125 MG tablet  Otalgia of left ear  Drainage from left ear   Plan: Augmentin and Ciprodex otic prescribed.  Suggested symptomatic OTC remedies. Nasal saline spray for congestion.  Tylenol or Ibuprofen OTC prn.  Call/return prn

## 2023-03-05 NOTE — Telephone Encounter (Signed)
Patient dropped off document  Short Term Disability Leave paperwork , to be filled out by provider. Patient requested to send it back via Call Patient to pick up within 7-days. Document is located in providers tray at front office.Please advise at Mobile [phone] (mobile)   Patient also provided additional information attached to the paperwork in case it is needed.

## 2023-03-05 NOTE — Patient Instructions (Signed)
Take the oral antibiotic with food and water and use the topical medication as well.   Follow up if you are not improving in the next 3-4 days.

## 2023-03-06 NOTE — Telephone Encounter (Signed)
I have the forms please allow 7-10 business days to do the forms.

## 2023-03-10 NOTE — Telephone Encounter (Signed)
Forms have been completed and faxed successfully. Patient has been made aware.

## 2023-03-12 ENCOUNTER — Other Ambulatory Visit (HOSPITAL_COMMUNITY)
Admission: RE | Admit: 2023-03-12 | Discharge: 2023-03-12 | Disposition: A | Discharge status: Home / Self Care | Payer: Self-pay | Source: Ambulatory Visit | Attending: Oncology | Admitting: Oncology

## 2023-03-23 LAB — GENECONNECT MOLECULAR SCREEN: Genetic Analysis Overall Interpretation: NEGATIVE

## 2023-06-22 ENCOUNTER — Other Ambulatory Visit (HOSPITAL_COMMUNITY): Payer: Self-pay

## 2023-06-25 ENCOUNTER — Other Ambulatory Visit (HOSPITAL_COMMUNITY): Payer: Self-pay

## 2023-07-20 ENCOUNTER — Ambulatory Visit: Payer: Managed Care, Other (non HMO) | Admitting: Internal Medicine

## 2023-07-23 ENCOUNTER — Ambulatory Visit: Admitting: Internal Medicine

## 2023-08-31 IMAGING — CT CT ABD-PELV W/ CM
2 of 4 series · 16 of 46 positions shown, 18 images · IV contrast (Omnipaque or Isovue)
Comparison: None.

CLINICAL DATA: Right lower quadrant pain.

EXAM:
CT ABDOMEN AND PELVIS WITH CONTRAST
TECHNIQUE: Multidetector CT imaging of the abdomen and pelvis was performed
using the standard protocol following bolus administration of
intravenous contrast.
CONTRAST:  100mL OMNIPAQUE IOHEXOL 300 MG/ML  SOLN

[Series 2: axial st · axial · 0.78mm/px · z∈[+158,+628]mm · 13 of 104 slices shown, 15 images]
[im 5/104  soft-tissue]
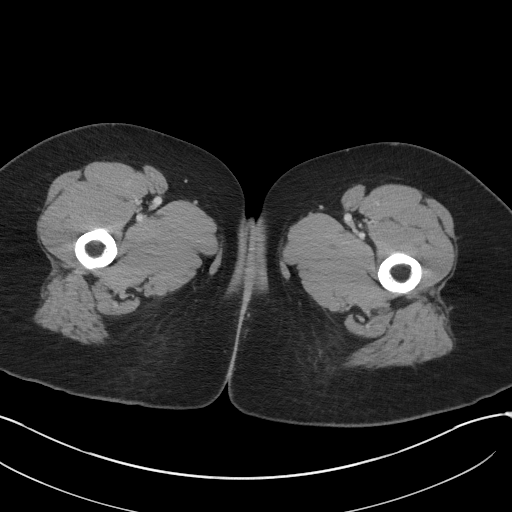
[im 5/104  bone]
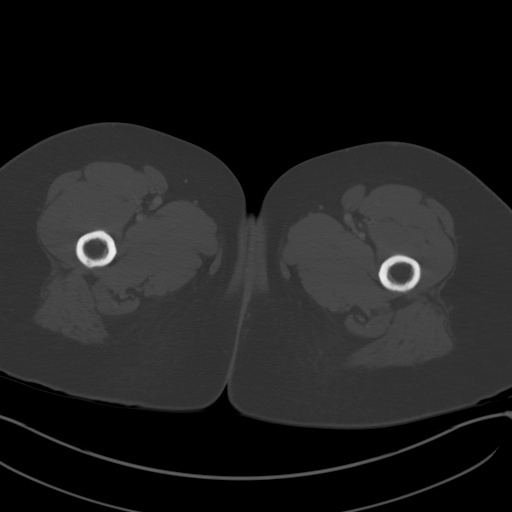
[im 13/104  soft-tissue]
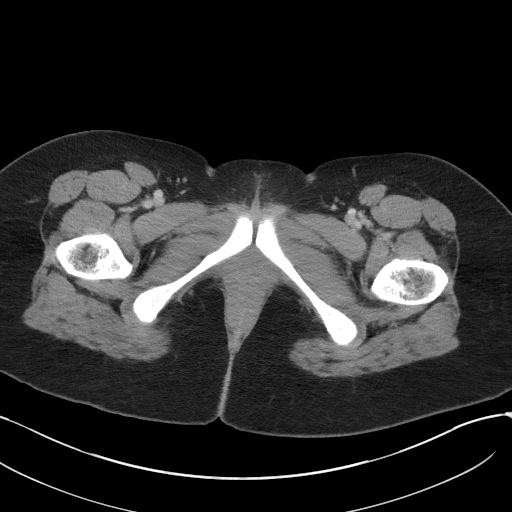
[im 21/104  soft-tissue]
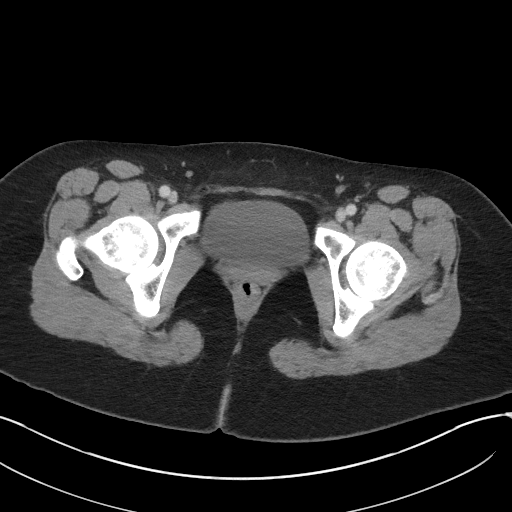
[im 29/104  soft-tissue]
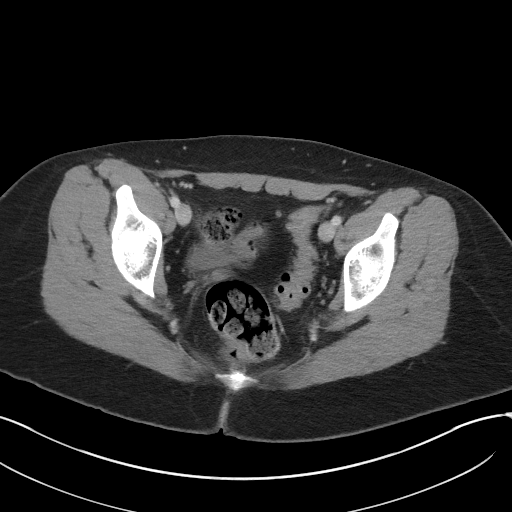
[im 38/104  soft-tissue]
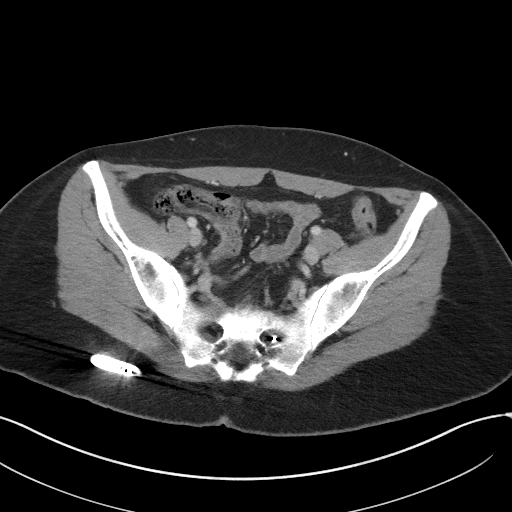
[im 46/104  soft-tissue]
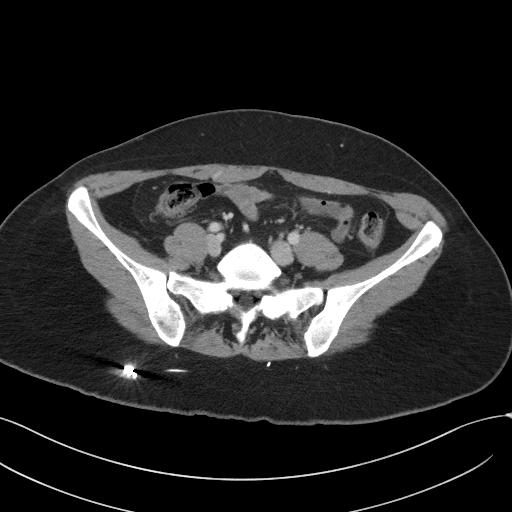
[im 54/104  soft-tissue]
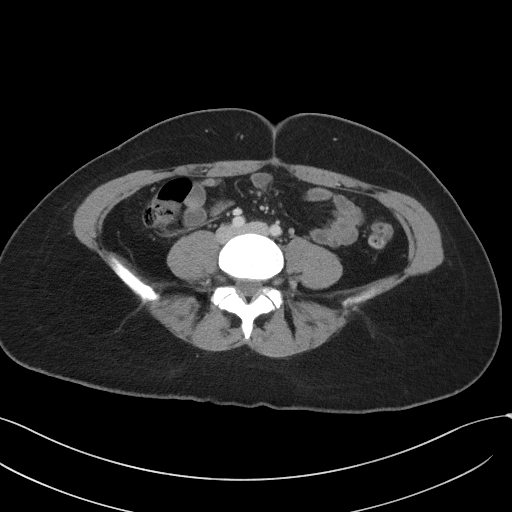
[im 58/104  soft-tissue]
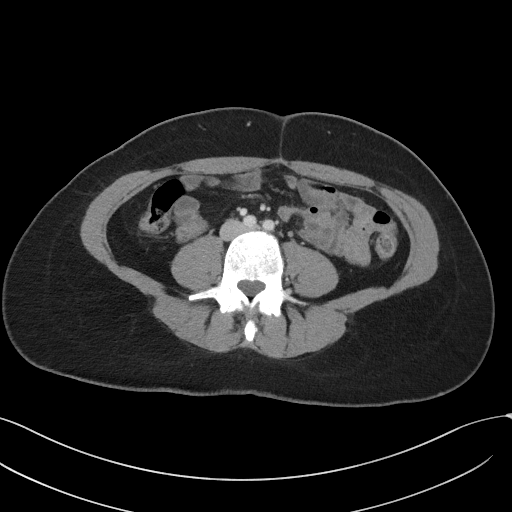
[im 66/104  soft-tissue]
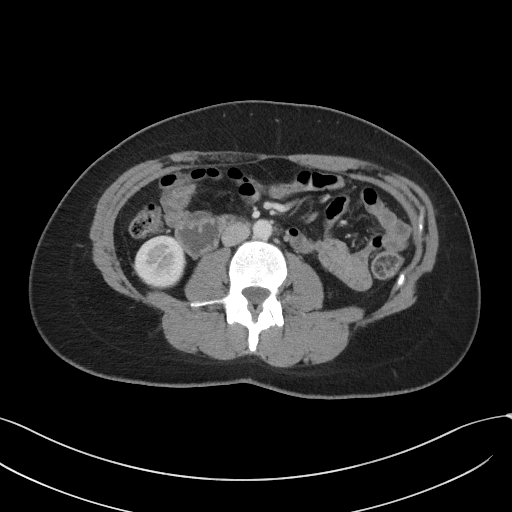
[im 66/104  bone]
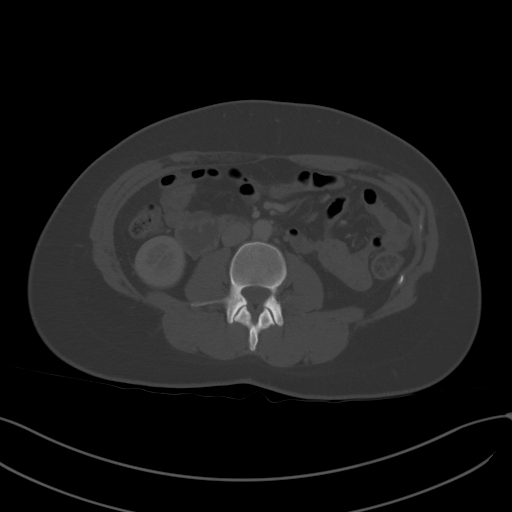
[im 75/104  soft-tissue]
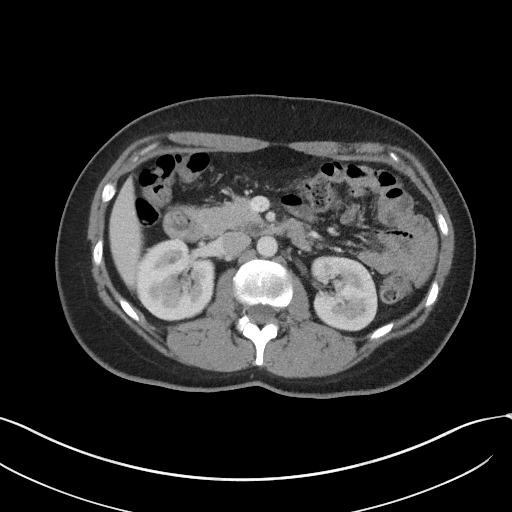
[im 83/104  soft-tissue]
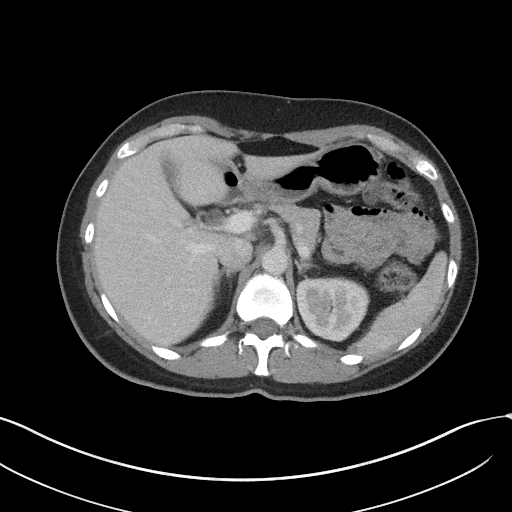
[im 91/104  soft-tissue]
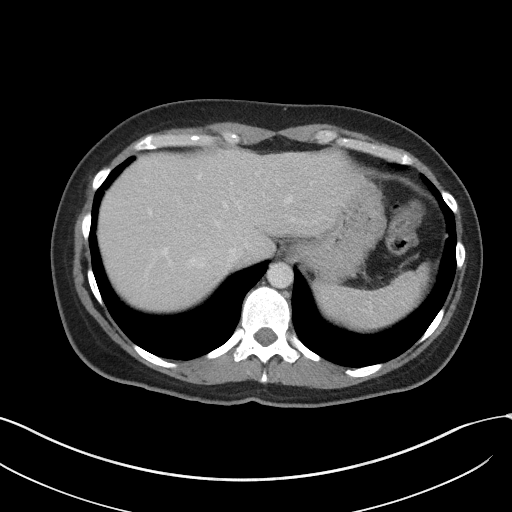
[im 99/104  soft-tissue]
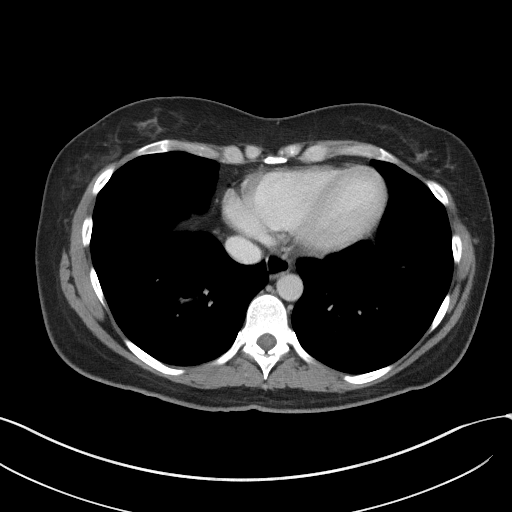

[Series 5: coronal st · coronal · 0.93mm/px · 3 of 89 slices shown]
[im 30/89  soft-tissue]
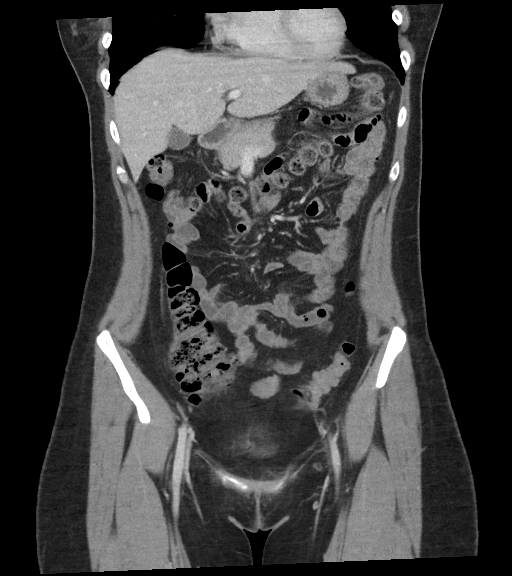
[im 40/89  soft-tissue]
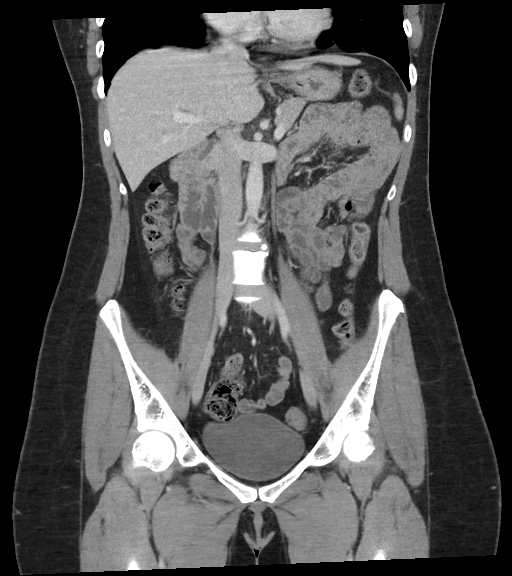
[im 49/89  soft-tissue]
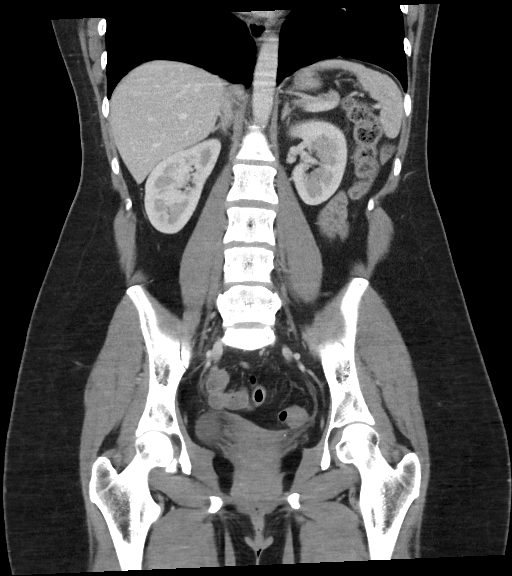

[16 of 46 positions shown; findings below may reference images not displayed]

FINDINGS: Lower chest: No acute abnormality.

Hepatobiliary: No focal liver abnormality is seen. No gallstones,
gallbladder wall thickening, or biliary dilatation.

Pancreas: Unremarkable. No pancreatic ductal dilatation or
surrounding inflammatory changes.

Spleen: Normal in size without focal abnormality.

Adrenals/Urinary Tract: Adrenal glands are unremarkable. Kidneys are
normal, without renal calculi, focal lesion, or hydronephrosis.
Bladder is unremarkable.

Stomach/Bowel: Stomach is within normal limits. Appendix appears
normal. No evidence of bowel wall thickening, distention, or
inflammatory changes. There is sigmoid colon diverticulosis without
evidence for acute diverticulitis.

Vascular/Lymphatic: No significant vascular findings are present. No
enlarged abdominal or pelvic lymph nodes.

Reproductive: Status post hysterectomy. No adnexal masses.

Other: No abdominal wall hernia or abnormality. No abdominopelvic
ascites.

Musculoskeletal: No acute or significant osseous findings.
Right-sided sacral stimulator device present.
IMPRESSION: 1. No acute localizing process in the abdomen or pelvis. The
appendix appears normal.
2. Sigmoid colon diverticulosis without evidence for diverticulitis.

## 2023-09-28 IMAGING — DX DG ABDOMEN 1V
2 series · 2 of 2 positions shown · non-contrast
Comparison: None.

CLINICAL DATA: Evaluate for sacral nerve stimulator.

EXAM:
ABDOMEN - 1 VIEW

[abdomen kub (1 of 2)]
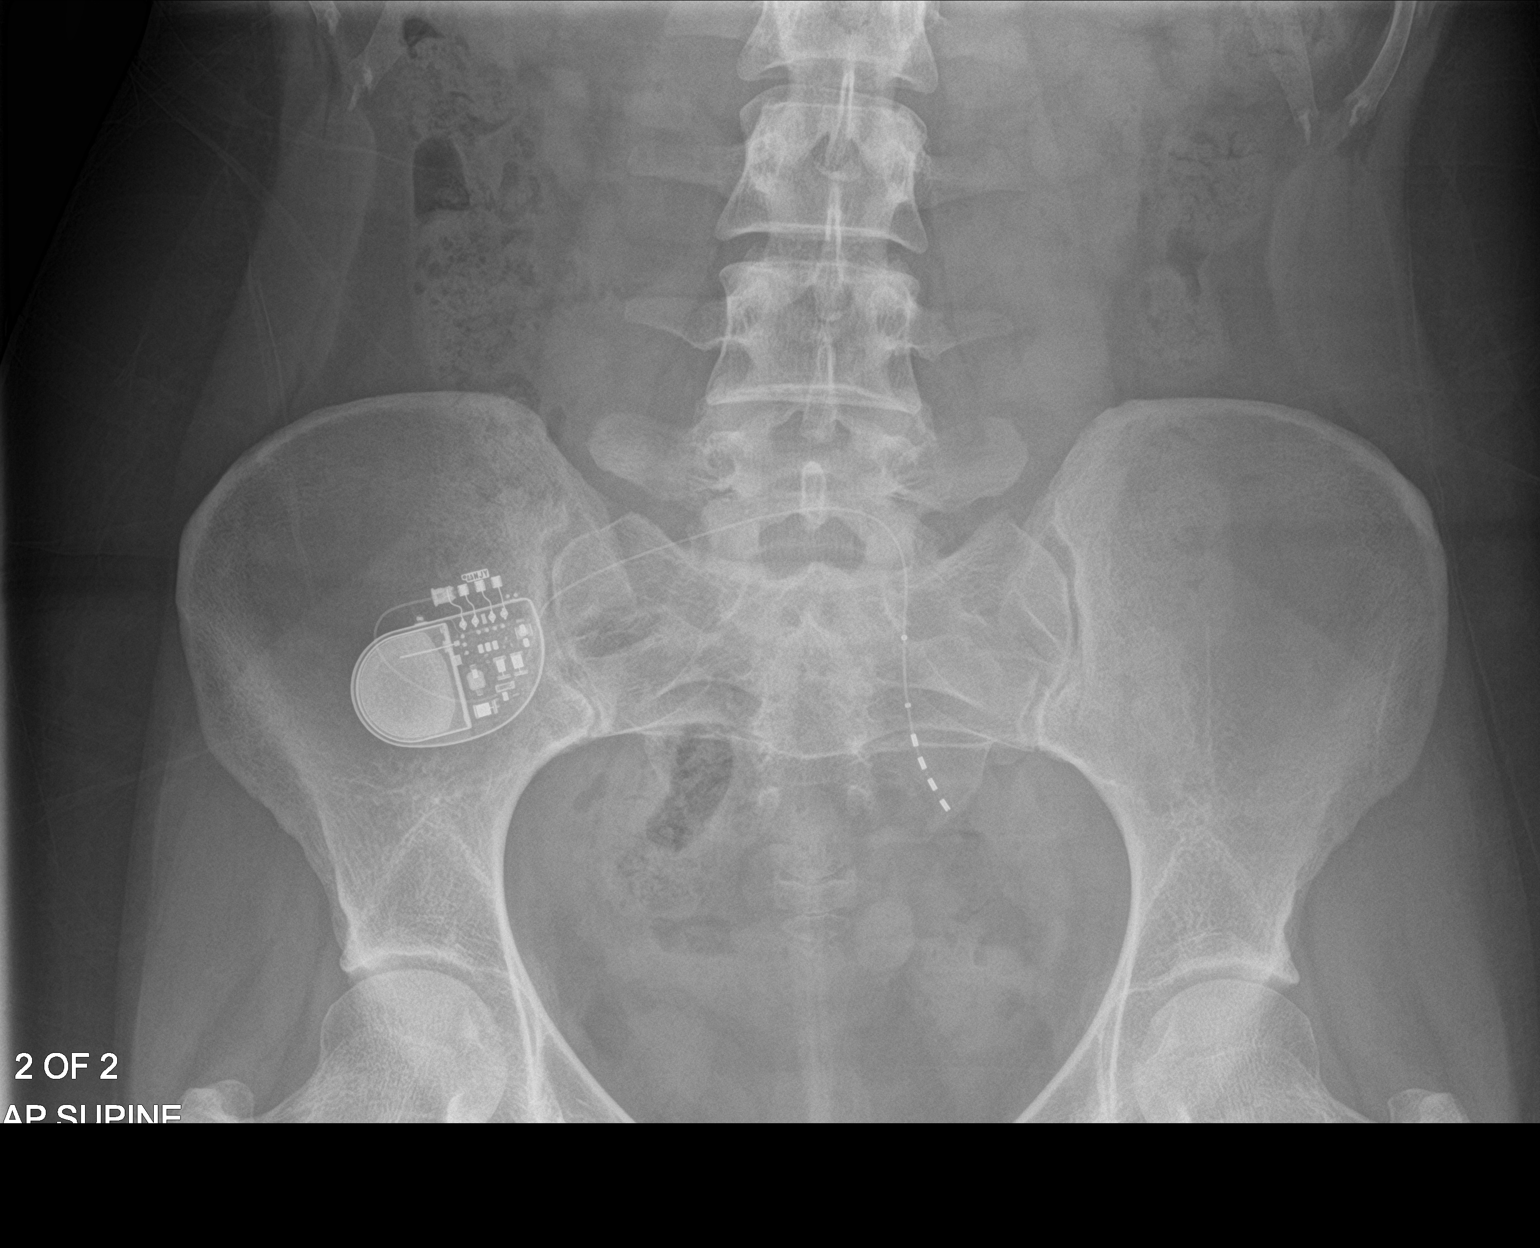

[abdomen kub (2 of 2)]
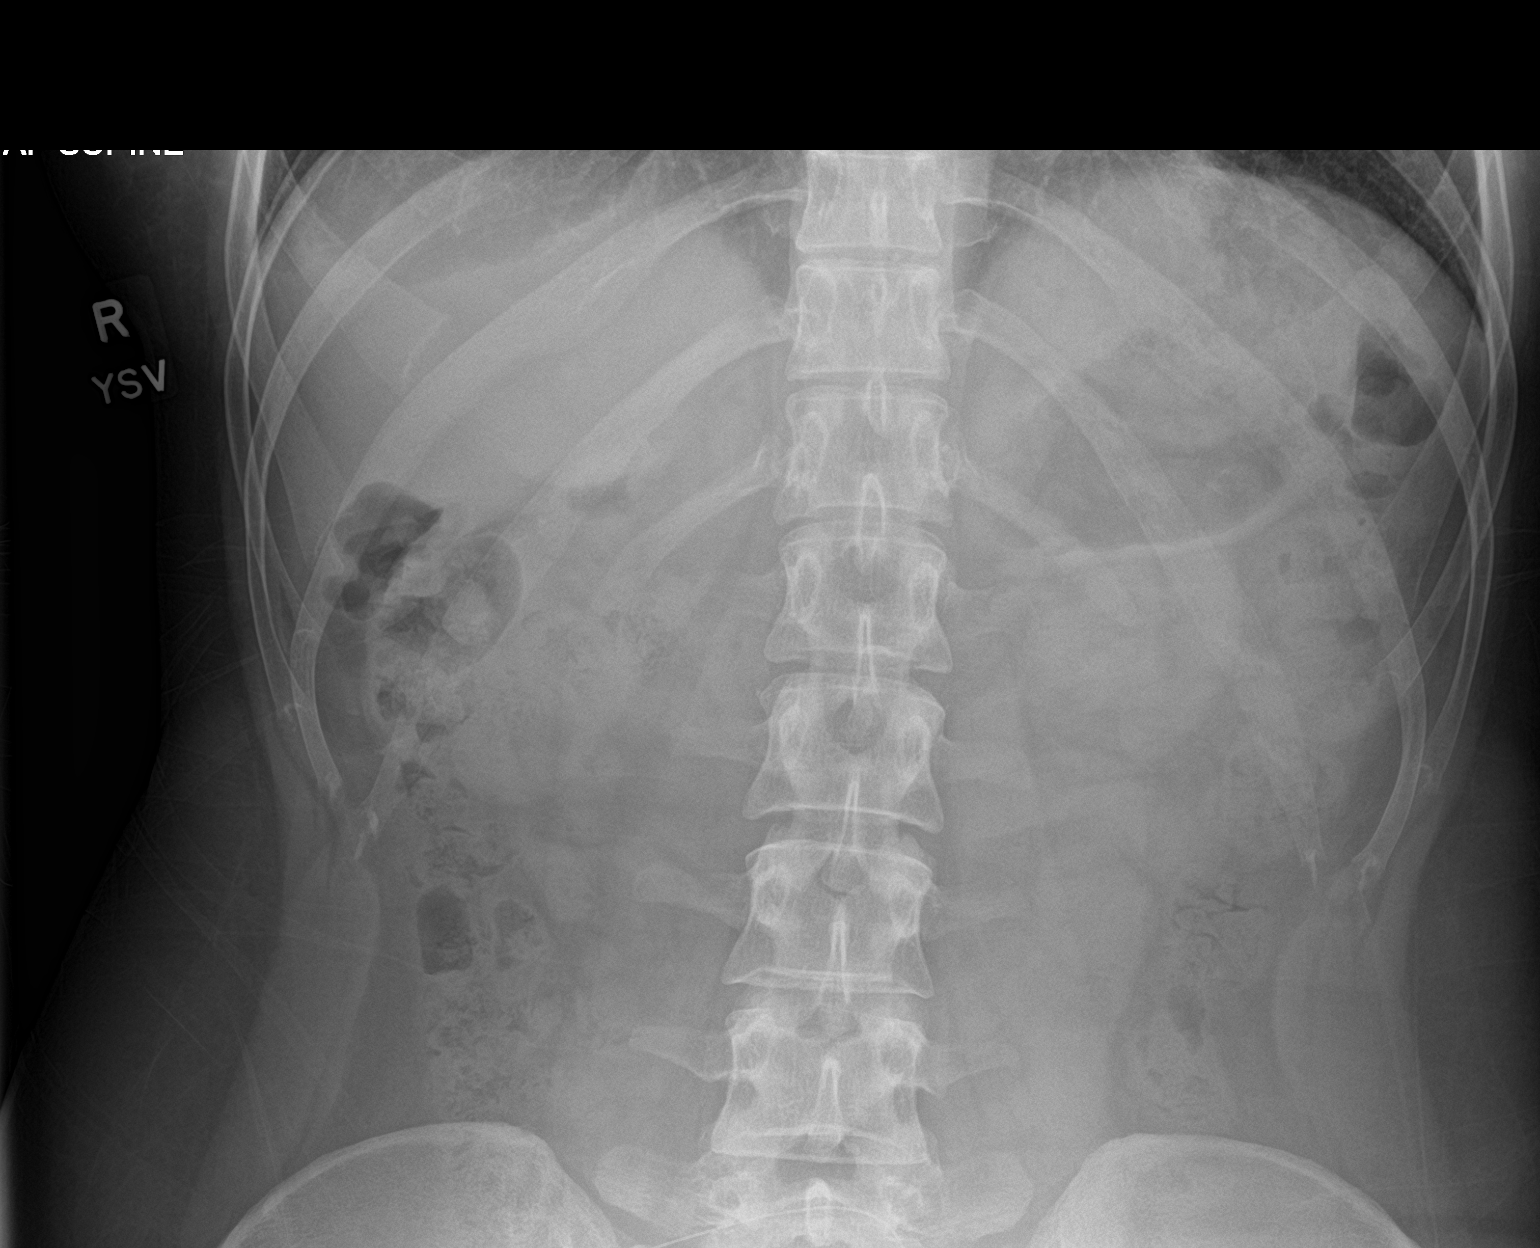

[2 of 2 positions shown; findings below may reference images not displayed]

FINDINGS: A stimulator is noted with generator over the right lower quadrant
and right iliac bone. A single lead extends to the inferior left
sacral ala. The similar wire appear intact.

No bowel dilatation or evidence of obstruction. No free air a
calculi. Moderate stool throughout the colon. The osseous structures
are intact. The soft tissues are unremarkable.
IMPRESSION: 1. Stimulator as described above.
2. No bowel obstruction.

## 2024-01-15 ENCOUNTER — Encounter: Payer: Self-pay | Admitting: Family Medicine

## 2024-01-15 ENCOUNTER — Ambulatory Visit: Admitting: Family Medicine

## 2024-01-15 VITALS — BP 92/80 | HR 95 | Temp 98.6°F | Ht 70.0 in | Wt 176.2 lb

## 2024-01-15 DIAGNOSIS — F5104 Psychophysiologic insomnia: Secondary | ICD-10-CM

## 2024-01-15 DIAGNOSIS — G4700 Insomnia, unspecified: Secondary | ICD-10-CM

## 2024-01-15 DIAGNOSIS — F411 Generalized anxiety disorder: Secondary | ICD-10-CM

## 2024-01-15 DIAGNOSIS — M79642 Pain in left hand: Secondary | ICD-10-CM

## 2024-01-15 DIAGNOSIS — F4312 Post-traumatic stress disorder, chronic: Secondary | ICD-10-CM

## 2024-01-15 DIAGNOSIS — J4520 Mild intermittent asthma, uncomplicated: Secondary | ICD-10-CM

## 2024-01-15 MED ORDER — DOXEPIN HCL 50 MG PO CAPS: 50.0000 mg | ORAL_CAPSULE | Freq: Every day | ORAL | 1 refills | Status: DC

## 2024-01-15 MED ORDER — BISOPROLOL FUMARATE 5 MG PO TABS: 5.0000 mg | ORAL_TABLET | Freq: Every day | ORAL | 1 refills | Status: AC

## 2024-01-15 NOTE — Patient Instructions (Addendum)
 Books: Vagus Nerve Reset It Didn't Start With You  EMDR, brain spotting to help rewire your brain from trauma.  Melissa Tuttle ([employer] app, or [employer]).   Let's try doxpepin 50mg  once daily at bedtime for sleep.   Bisoprolol  5mg  once daily as needed for anxiety.  Follow up with me in one month for evaluation of medication effectiveness.

## 2024-01-15 NOTE — Progress Notes (Signed)
 Acute Office Visit  Subjective:     Patient ID: Rebecca Medina, female    DOB: [birth year], 38 y.o.   MRN: [id number]  Chief Complaint  Patient presents with   Insomnia    C/o not getting more than 3hrs of sleep ongoing since August  Requesting referral for sleep study and nerve damage in left middle finger  Elevated pulse ongoing for mths    HPI  Discussed the use of AI scribe software for clinical note transcription with the patient, who gave verbal consent to proceed.  History of Present Illness Analy Medina is a 38 year old female who presents with persistent finger pain and sleep issues.  Left middle finger pain - Persistent burning pain localized to the left middle finger - Onset following extensive use of scissors and drawing tools - Pain exacerbated by activities requiring a pencil-like grip - Limits ability to perform tasks for extended periods - No prior treatment for this pain  Sleep disturbances - Difficulty falling and staying asleep - Requires medication to initiate sleep - Has trialed trazodone, doxepin , hydroxyzine , and Benadryl with varying success - Trazodone and doxepin  result in feeling 'unconscious' rather than rested - Trazodone worsens nightmares - Occasional use of Restoril with continued poor sleep quality and frequent nightmares  Night sweats - Experiences night sweats - Associates symptoms with menopause and stress     ROS Per HPI      Objective:    BP 92/80 (BP Location: Right Arm, Patient Position: Sitting)   Pulse 95   Temp 98.6 F (37 C) (Temporal)   Ht 5' 10 (1.778 m)   Wt 176 lb 3.2 oz (79.9 kg)   SpO2 97%   BMI 25.28 kg/m    Physical Exam Vitals and nursing note reviewed.  Constitutional:      General: She is not in acute distress.    Appearance: Normal appearance. She is normal weight.  HENT:     Head: Normocephalic and atraumatic.     Right Ear: External ear normal.     Left Ear: External ear normal.     Nose: Nose  normal.     Mouth/Throat:     Mouth: Mucous membranes are moist.     Pharynx: Oropharynx is clear.  Eyes:     Extraocular Movements: Extraocular movements intact.     Pupils: Pupils are equal, round, and reactive to light.  Cardiovascular:     Rate and Rhythm: Normal rate and regular rhythm.     Pulses: Normal pulses.     Heart sounds: Normal heart sounds.  Pulmonary:     Effort: Pulmonary effort is normal. No respiratory distress.     Breath sounds: Normal breath sounds. No wheezing, rhonchi or rales.  Musculoskeletal:        General: Normal range of motion.     Cervical back: Normal range of motion.     Right lower leg: No edema.     Left lower leg: No edema.  Lymphadenopathy:     Cervical: No cervical adenopathy.  Neurological:     General: No focal deficit present.     Mental Status: She is alert and oriented to person, place, and time.  Psychiatric:        Mood and Affect: Mood is anxious.        Thought Content: Thought content normal.     No results found for any visits on 01/15/24.      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 Limited success with previous medications. Concerns about benzodiazepine addiction. Current management includes Benadryl and Restoril. Discussed potential sleep study and trauma-focused therapies. - Prescribed doxepin  as needed for sleep. - Referred to Dr. Domeier for a sleep study. - Discussed EMDR and brain spotting with a therapist.  Generalized anxiety disorder with MDD, recurrent, mod Generalized anxiety disorder with history of major depressive disorder. Previous medications discontinued due to adverse effects. Current management includes hydroxyzine  and propranolol. Discussed potential benefits of bisoprolol  and trauma-focused therapies. - Prescribed bisoprolol  as needed for anxiety and  heart rate management. - Discussed EMDR and brain spotting with a therapist.  Left hand pain  with possible mononeuropathy Chronic left hand pain with possible mononeuropathy, exacerbated by repetitive activities. Differential includes nerve damage or tendon issues. - Referred to sports medicine for ultrasound evaluation of the left hand.     Orders Placed This Encounter  Procedures   Ambulatory referral to Sports Medicine    Referral Priority:   Routine    Referral Type:   Consultation    Number of Visits Requested:   1   Ambulatory referral to Neurology    Referral Priority:   Routine    Referral Type:   Consultation    Referral Reason:   Specialty Services Required    Requested Specialty:   Neurology    Number of Visits Requested:   1     Meds ordered this encounter  Medications   DISCONTD: doxepin  (SINEQUAN ) 50 MG capsule    Sig: Take 1 capsule (50 mg total) by mouth at bedtime.    Dispense:  30 capsule    Refill:  1   bisoprolol  (ZEBETA ) 5 MG tablet    Sig: Take 1 tablet (5 mg total) by mouth daily.    Dispense:  30 tablet    Refill:  1   doxepin  (SINEQUAN ) 50 MG capsule    Sig: Take 1 capsule (50 mg total) by mouth at bedtime.    Dispense:  30 capsule    Refill:  1    Return in about 4 weeks (around 02/12/2024) for meds OV.  Rebecca LITTIE Ku, FNP

## 2024-01-19 ENCOUNTER — Ambulatory Visit: Admitting: Sports Medicine

## 2024-01-19 VITALS — HR 73 | Ht 70.0 in | Wt 183.0 lb

## 2024-01-19 DIAGNOSIS — M79642 Pain in left hand: Secondary | ICD-10-CM

## 2024-01-19 DIAGNOSIS — M654 Radial styloid tenosynovitis [de Quervain]: Secondary | ICD-10-CM

## 2024-01-19 DIAGNOSIS — M79645 Pain in left finger(s): Secondary | ICD-10-CM

## 2024-01-19 MED ORDER — MELOXICAM 15 MG PO TABS: ORAL_TABLET | ORAL | 0 refills | Status: DC

## 2024-01-19 NOTE — Progress Notes (Signed)
 Ben Kia Varnadore D.CLEMENTEEN AMYE [facility] [address] [place] [address] Phone: [phone]   Assessment and Plan:     1. Left hand pain (Primary) 2. Pain of left middle finger 3. De Quervain's tenosynovitis, left -Chronic with exacerbation, initial sports medicine visit - Most consistent with pain at left third DIP and DeQuervain's Tenosynovitis of left wrist likely flared by physical activity with arts and crafts, holding scissors - During flares, recommend altering activity to decrease musculoskeletal flare - Start meloxicam  15 mg daily x2 weeks.  If still having pain after 2 weeks, complete 3rd-week of NSAID. May use remaining NSAID as needed once daily for pain control.  Do not to use additional over-the-counter NSAIDs (ibuprofen , naproxen, Advil , Aleve, etc.) while taking prescription NSAIDs.  May use Tylenol  [phone] mg 2 to 3 times a day for breakthrough pain. -Means topical Voltaren gel over areas of pain - Recommend starting HEP for wrist  15 additional minutes spent for educating Therapeutic Home Exercise Program.  This included exercises focusing on stretching, strengthening, with focus on eccentric aspects.   Long term goals include an improvement in range of motion, strength, endurance as well as avoiding reinjury. Patient's frequency would include in 1-2 times a day, 3-5 times a week for a duration of 6-12 weeks. Proper technique shown and discussed handout in great detail with ATC.  All questions were discussed and answered.      Pertinent previous records reviewed include none   Follow Up: As an no improvement or worsening of symptoms in 4 weeks.  Could consider x-ray versus CSI versus prednisone course versus OT/PT   Subjective:   I, Rebecca Medina, am serving as a neurosurgeon for Rebecca Medina  Chief Complaint: left hand  pain   HPI:   01/19/24 Patient is a 38 year old female with left hand  pain. Patient states pain for a year.  She was using a lot of scissors and arts and crafts things. Pain starts thumb and goes up to her elbow. She isnt able to do arts and crafts or write with out pain. Decreased ROM. No numbness or tingling. No meds for the pain. Grip strength is WNL for her. She isnt able to the things she wants to   Relevant Historical Information: None pertinent  Additional pertinent review of systems negative.   Current Outpatient Medications:    meloxicam  (MOBIC ) 15 MG tablet, Take 1 tablet daily for 2 weeks.  If still in pain after 2 weeks, take 1 tablet daily for an additional 1 week., Disp: 30 tablet, Rfl: 0   bisoprolol  (ZEBETA ) 5 MG tablet, Take 1 tablet (5 mg total) by mouth daily., Disp: 30 tablet, Rfl: 1   doxepin  (SINEQUAN ) 50 MG capsule, Take 1 capsule (50 mg total) by mouth at bedtime., Disp: 30 capsule, Rfl: 1   estradiol  (ESTRACE ) 1 MG tablet, Take 1 mg by mouth., Disp: , Rfl:    progesterone (PROMETRIUM) 200 MG capsule, Take 200 mg by mouth., Disp: , Rfl:   Current Facility-Administered Medications:    botulinum toxin Type A  (BOTOX ) injection 155 Units, 155 Units, Intramuscular, Once, Medina, Duwaine, NP   Objective:     Vitals:   01/19/24 1434  Pulse: 73  SpO2: 98%  Weight: 183 lb (83 kg)  Height: 5' 10 (1.778 m)      Body mass index is 26.26 kg/m.    Physical Exam:    General: Appears well, nad, nontoxic and pleasant Neuro:sensation intact,  strength is 5/5 in upper extremities, muscle tone wnl Skin:no susupicious lesions or rashes  Left hand/Wrist:   No deformity or swelling appreciated. ROM  Ext 90, flexion 70, radial/ulnar deviation 30 TTP mildly third DIP nttp over the snuff box, dorsal carpals, volar carpals, radial styloid, ulnar styloid, 1st mcp, tfcc Negative Tinel's, Phalen's, Prayer Tests Negative finklestein Neg tfcc bounce test No pain with resisted ext, flex or deviation    Electronically signed by:  [facility] 3:16 PM  01/19/24

## 2024-01-19 NOTE — Patient Instructions (Signed)
-   Start meloxicam  15 mg daily x2 weeks.  If still having pain after 2 weeks, complete 3rd-week of NSAID. May use remaining NSAID as needed once daily for pain control.  Do not to use additional over-the-counter NSAIDs (ibuprofen , naproxen, Advil , Aleve, etc.) while taking prescription NSAIDs.  May use Tylenol  [phone] mg 2 to 3 times a day for breakthrough pain.  Thumb HEP   Voltaren gel over areas of pain   No interaction between Tremfya and meloxicam    As needed follow up, if no improvement 4 week follow up

## 2024-02-08 ENCOUNTER — Telehealth: Payer: Self-pay | Admitting: Adult Health

## 2024-02-08 NOTE — Telephone Encounter (Signed)
 Pt called stating that she has called and LVM and no one has gotten back to her about her Sleep Referral. Pt would like a call back to get scheduled. Please advise.

## 2024-02-22 NOTE — Telephone Encounter (Signed)
 Pt has been scheduled.

## 2024-03-10 ENCOUNTER — Encounter: Payer: Self-pay | Admitting: Neurology

## 2024-03-10 ENCOUNTER — Ambulatory Visit: Admitting: Neurology

## 2024-03-10 VITALS — BP 114/79 | HR 89 | Ht 70.0 in | Wt 202.6 lb

## 2024-03-10 DIAGNOSIS — G4700 Insomnia, unspecified: Secondary | ICD-10-CM

## 2024-03-10 DIAGNOSIS — E663 Overweight: Secondary | ICD-10-CM

## 2024-03-10 DIAGNOSIS — R635 Abnormal weight gain: Secondary | ICD-10-CM

## 2024-03-10 DIAGNOSIS — R5383 Other fatigue: Secondary | ICD-10-CM

## 2024-03-10 DIAGNOSIS — R0683 Snoring: Secondary | ICD-10-CM | POA: Diagnosis not present

## 2024-03-10 DIAGNOSIS — Z9189 Other specified personal risk factors, not elsewhere classified: Secondary | ICD-10-CM
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
## 2024-03-10 NOTE — Patient Instructions (Signed)
 Based on your symptoms and your exam I believe we should look for an underlying organic sleep disorder, such as obstructive sleep apnea/OSA, with a sleep study.  I would recommend that you talk to your psychiatrist about restarting your medication.  Please avoid stopping any medication without prior consultation with your prescriber.   Please do not drive if you feel sleepy.  As discussed, at this point, we should proceed with a sleep study to further delineate your sleep-related issues.   If your sleep study shows that you have obstructive sleep apnea (OSA), even if you have mild OSA, I may want you to consider treatment with CPAP. Please remember, the risks and ramifications of moderate to severe obstructive sleep apnea or OSA are: Cardiovascular disease, including congestive heart failure, stroke, difficult to control hypertension, arrhythmias, and even type 2 diabetes has been linked to untreated OSA. Sleep apnea causes disruption of sleep and sleep deprivation in most cases, which, in turn, can cause recurrent headaches, problems with memory, mood, concentration, focus, and vigilance. Most people with untreated sleep apnea report excessive daytime sleepiness, which can affect their ability to drive. Please do not drive if you feel sleepy.   We can plan a follow-up after your sleep testing.  If your insurance denies a laboratory attended sleep study, I may offer you a home sleep test next.   As discussed, a home sleep test looks at sleep apnea only.    You can continue your current medication regimen for your sleep test.  Please talk to your psychologist and psychiatrist about the possibility of pursuing cognitive behavioral therapy for chronic insomnia.

## 2024-03-11 ENCOUNTER — Other Ambulatory Visit: Payer: Self-pay | Admitting: Family Medicine

## 2024-03-11 DIAGNOSIS — F411 Generalized anxiety disorder: Secondary | ICD-10-CM

## 2024-03-11 DIAGNOSIS — F5104 Psychophysiologic insomnia: Secondary | ICD-10-CM

## 2024-03-15 ENCOUNTER — Encounter: Payer: Self-pay | Admitting: Internal Medicine

## 2024-03-15 ENCOUNTER — Ambulatory Visit: Admitting: Internal Medicine

## 2024-03-15 VITALS — BP 112/72 | HR 94 | Temp 98.4°F | Resp 16 | Ht 70.0 in | Wt 199.0 lb

## 2024-03-15 DIAGNOSIS — F5104 Psychophysiologic insomnia: Secondary | ICD-10-CM | POA: Insufficient documentation

## 2024-03-15 DIAGNOSIS — R739 Hyperglycemia, unspecified: Secondary | ICD-10-CM | POA: Insufficient documentation

## 2024-03-15 DIAGNOSIS — F332 Major depressive disorder, recurrent severe without psychotic features: Secondary | ICD-10-CM

## 2024-03-15 LAB — CBC WITH DIFFERENTIAL/PLATELET
Basophils Absolute: 0.1 10*3/uL (ref 0.0–0.1)
Basophils Relative: 0.9 % (ref 0.0–3.0)
Eosinophils Absolute: 0 10*3/uL (ref 0.0–0.7)
Eosinophils Relative: 0.8 % (ref 0.0–5.0)
HCT: 37.4 % (ref 36.0–46.0)
Hemoglobin: 12.7 g/dL (ref 12.0–15.0)
Lymphocytes Relative: 27 % (ref 12.0–46.0)
Lymphs Abs: 1.5 10*3/uL (ref 0.7–4.0)
MCHC: 33.9 g/dL (ref 30.0–36.0)
MCV: 86.5 fl (ref 78.0–100.0)
Monocytes Absolute: 0.5 10*3/uL (ref 0.1–1.0)
Monocytes Relative: 8 % (ref 3.0–12.0)
Neutro Abs: 3.6 10*3/uL (ref 1.4–7.7)
Neutrophils Relative %: 63.3 % (ref 43.0–77.0)
Platelets: 350 10*3/uL (ref 150.0–400.0)
RBC: 4.32 Mil/uL (ref 3.87–5.11)
RDW: 13.1 % (ref 11.5–15.5)
WBC: 5.7 10*3/uL (ref 4.0–10.5)

## 2024-03-15 LAB — BASIC METABOLIC PANEL WITH GFR
BUN: 13 mg/dL (ref 6–23)
CO2: 27 meq/L (ref 19–32)
Calcium: 9.6 mg/dL (ref 8.4–10.5)
Chloride: 103 meq/L (ref 96–112)
Creatinine, Ser: 0.74 mg/dL (ref 0.40–1.20)
GFR: 103.11 mL/min
Glucose, Bld: 87 mg/dL (ref 70–99)
Potassium: 4 meq/L (ref 3.5–5.1)
Sodium: 138 meq/L (ref 135–145)

## 2024-03-15 LAB — HEPATIC FUNCTION PANEL
ALT: 19 U/L (ref 3–35)
AST: 22 U/L (ref 5–37)
Albumin: 4.2 g/dL (ref 3.5–5.2)
Alkaline Phosphatase: 55 U/L (ref 39–117)
Bilirubin, Direct: 0.1 mg/dL (ref 0.1–0.3)
Total Bilirubin: 0.4 mg/dL (ref 0.2–1.2)
Total Protein: 7.5 g/dL (ref 6.0–8.3)

## 2024-03-15 LAB — HEMOGLOBIN A1C: Hgb A1c MFr Bld: 5.2 % (ref 4.6–6.5)

## 2024-03-15 LAB — TSH: TSH: 1.53 u[IU]/mL (ref 0.35–5.50)

## 2024-03-15 MED ORDER — DOXEPIN HCL 50 MG PO CAPS: 50.0000 mg | ORAL_CAPSULE | Freq: Every day | ORAL | 1 refills | Status: AC

## 2024-03-15 NOTE — Patient Instructions (Signed)
 Health Maintenance, Female Adopting a healthy lifestyle and getting preventive care are important in promoting health and wellness. Ask your health care provider about: The right schedule for you to have regular tests and exams. Things you can do on your own to prevent diseases and keep yourself healthy. What should I know about diet, weight, and exercise? Eat a healthy diet  Eat a diet that includes plenty of vegetables, fruits, low-fat dairy products, and lean protein. Do not eat a lot of foods that are high in solid fats, added sugars, or sodium. Maintain a healthy weight Body mass index (BMI) is used to identify weight problems. It estimates body fat based on height and weight. Your health care provider can help determine your BMI and help you achieve or maintain a healthy weight. Get regular exercise Get regular exercise. This is one of the most important things you can do for your health. Most adults should: Exercise for at least 150 minutes each week. The exercise should increase your heart rate and make you sweat (moderate-intensity exercise). Do strengthening exercises at least twice a week. This is in addition to the moderate-intensity exercise. Spend less time sitting. Even light physical activity can be beneficial. Watch cholesterol and blood lipids Have your blood tested for lipids and cholesterol at 38 years of age, then have this test every 5 years. Have your cholesterol levels checked more often if: Your lipid or cholesterol levels are high. You are older than 38 years of age. You are at high risk for heart disease. What should I know about cancer screening? Depending on your health history and family history, you may need to have cancer screening at various ages. This may include screening for: Breast cancer. Cervical cancer. Colorectal cancer. Skin cancer. Lung cancer. What should I know about heart disease, diabetes, and high blood pressure? Blood pressure and heart  disease High blood pressure causes heart disease and increases the risk of stroke. This is more likely to develop in people who have high blood pressure readings or are overweight. Have your blood pressure checked: Every 3-5 years if you are 32-37 years of age. Every year if you are 71 years old or older. Diabetes Have regular diabetes screenings. This checks your fasting blood sugar level. Have the screening done: Once every three years after age 24 if you are at a normal weight and have a low risk for diabetes. More often and at a younger age if you are overweight or have a high risk for diabetes. What should I know about preventing infection? Hepatitis B If you have a higher risk for hepatitis B, you should be screened for this virus. Talk with your health care provider to find out if you are at risk for hepatitis B infection. Hepatitis C Testing is recommended for: Everyone born from [birth year] through 1964. Anyone with known risk factors for hepatitis C. Sexually transmitted infections (STIs) Get screened for STIs, including gonorrhea and chlamydia, if: You are sexually active and are younger than 38 years of age. You are older than 38 years of age and your health care provider tells you that you are at risk for this type of infection. Your sexual activity has changed since you were last screened, and you are at increased risk for chlamydia or gonorrhea. Ask your health care provider if you are at risk. Ask your health care provider about whether you are at high risk for HIV. Your health care provider may recommend a prescription medicine to help prevent HIV  infection. If you choose to take medicine to prevent HIV, you should first get tested for HIV. You should then be tested every 3 months for as long as you are taking the medicine. Pregnancy If you are about to stop having your period (premenopausal) and you may become pregnant, seek counseling before you get pregnant. Take 400 to 800  micrograms (mcg) of folic acid every day if you become pregnant. Ask for birth control (contraception) if you want to prevent pregnancy. Osteoporosis and menopause Osteoporosis is a disease in which the bones lose minerals and strength with aging. This can result in bone fractures. If you are 42 years old or older, or if you are at risk for osteoporosis and fractures, ask your health care provider if you should: Be screened for bone loss. Take a calcium  or vitamin D  supplement to lower your risk of fractures. Be given hormone replacement therapy (HRT) to treat symptoms of menopause. Follow these instructions at home: Alcohol use Do not drink alcohol if: Your health care provider tells you not to drink. You are pregnant, may be pregnant, or are planning to become pregnant. If you drink alcohol: Limit how much you have to: 0-1 drink a day. Know how much alcohol is in your drink. In the [place] one drink equals one 12 oz bottle of beer (355 mL), one 5 oz glass of wine (148 mL), or one 1 oz glass of hard liquor (44 mL). Lifestyle Do not use any products that contain nicotine or tobacco. These products include cigarettes, chewing tobacco, and vaping devices, such as e-cigarettes. If you need help quitting, ask your health care provider. Do not use street drugs. Do not share needles. Ask your health care provider for help if you need support or information about quitting drugs. General instructions Schedule regular health, dental, and eye exams. Stay current with your vaccines. Tell your health care provider if: You often feel depressed. You have ever been abused or do not feel safe at home. This information is not intended to replace advice given to you by your health care provider. Make sure you discuss any questions you have with your health care provider. Document Revised: 08/05/2023 Document Reviewed: 06/18/2020 Elsevier Patient Education  2025 [employer].

## 2024-03-15 NOTE — Progress Notes (Unsigned)
 "  Subjective:  Patient ID: Rebecca Medina, female    DOB: [birth year]  Age: 39 y.o. MRN: [id number]  CC: Depression   HPI Rebecca Medina presents for a CPX and f/up -    Discussed the use of AI scribe software for clinical note transcription with the patient, who gave verbal consent to proceed.  History of Present Illness Rebecca Medina is a 39 year old female who presents with insomnia and weight gain.  She experiences significant insomnia, which has been ongoing and unresponsive to current treatments. She uses doxepin , which helps her stay asleep but does not aid in falling asleep. She has difficulty waking up in the morning and finds it hard to shake off the effects of doxepin , which makes getting up for work challenging. Previously, she could sleep for extended periods as a stress response, but now she struggles to fall asleep and stay asleep. Without medication, she sleeps in short spurts of 45 minutes to an hour, leading to increased irritability and anger. She has been seeing a Psych NP but would like second opinion  She has a history of taking Wellbutrin, which she recently restarted at a dose of 150 mg once daily. She had previously stopped a medication containing Wellbutrin due to severe side effects, including suicidal ideation. She also mentions a past prescription of Otezla for psoriasis, which she believes contributed to her depression.  She has experienced significant weight gain, approximately 50 pounds since June of the previous year, with recent minor weight loss. She attributes her weight gain to binge eating, particularly at night, driven by cravings for salty and sweet foods. She describes eating large quantities of food, such as crackers and cheese, and notes that her roommates help limit her nighttime eating due to embarrassment.  Her skin is excessively dry, particularly on her hands and feet, where she has psoriasis.  She has monitored her blood sugar levels in the past, noting high  readings even without food intake, which she found stressful. She does not experience excessive thirst or urination.  No current suicidal ideation but acknowledges past episodes of severe depression. She has not been diagnosed with bipolar disorder, although she has been told she might have it in the past. She describes a pattern of overspending related to a fear of running out of food, which she attributes to a deep-rooted fear response.     Outpatient Medications Prior to Visit  Medication Sig Dispense Refill   bisoprolol  (ZEBETA ) 5 MG tablet Take 1 tablet (5 mg total) by mouth daily. 30 tablet 1   buPROPion (WELLBUTRIN XL) 150 MG 24 hr tablet Take 150 mg by mouth every morning.     estradiol  (ESTRACE ) 1 MG tablet Take 1 mg by mouth.     progesterone (PROMETRIUM) 200 MG capsule Take 200 mg by mouth.     TREMFYA ONE-PRESS 100 MG/ML SOPN      doxepin  (SINEQUAN ) 50 MG capsule Take 1 capsule (50 mg total) by mouth at bedtime. 30 capsule 1   meloxicam  (MOBIC ) 15 MG tablet Take 1 tablet daily for 2 weeks.  If still in pain after 2 weeks, take 1 tablet daily for an additional 1 week. 30 tablet 0   Facility-Administered Medications Prior to Visit  Medication Dose Route Frequency Provider Last Rate Last Admin   botulinum toxin Type A  (BOTOX ) injection 155 Units  155 Units Intramuscular Once Millikan, Megan, NP        ROS Review of Systems  Constitutional:  Positive for  appetite change and unexpected weight change (wt gain). Negative for activity change, chills, diaphoresis, fatigue and fever.  HENT: Negative.    Eyes: Negative.   Respiratory: Negative.  Negative for cough, chest tightness, shortness of breath and wheezing.   Cardiovascular:  Negative for chest pain, palpitations and leg swelling.  Gastrointestinal:  Negative for abdominal pain, diarrhea, nausea and vomiting.  Endocrine: Negative.   Genitourinary: Negative.  Negative for difficulty urinating and dysuria.  Musculoskeletal:  Negative.   Skin: Negative.   Neurological:  Negative for dizziness, light-headedness and numbness.  Hematological:  Negative for adenopathy. Does not bruise/bleed easily.  Psychiatric/Behavioral:  Positive for dysphoric mood and sleep disturbance. Negative for behavioral problems, confusion, decreased concentration, hallucinations and suicidal ideas. The patient is nervous/anxious. The patient is not hyperactive.     Objective:  BP 112/72 (BP Location: Left Arm, Patient Position: Sitting, Cuff Size: Normal)   Pulse 94   Temp 98.4 F (36.9 C) (Oral)   Resp 16   Ht 5' 10 (1.778 m)   Wt 199 lb (90.3 kg)   SpO2 98%   BMI 28.55 kg/m   BP Readings from Last 3 Encounters:  03/17/24 118/82  03/15/24 112/72  03/10/24 114/79    Wt Readings from Last 3 Encounters:  03/17/24 190 lb (86.2 kg)  03/15/24 199 lb (90.3 kg)  03/10/24 202 lb 9.6 oz (91.9 kg)    Physical Exam Vitals reviewed.  Constitutional:      Appearance: Normal appearance.  HENT:     Nose: Nose normal.     Mouth/Throat:     Mouth: Mucous membranes are moist.  Eyes:     General: No scleral icterus.    Conjunctiva/sclera: Conjunctivae normal.  Cardiovascular:     Rate and Rhythm: Normal rate and regular rhythm.     Heart sounds: No murmur heard.    No friction rub. No gallop.  Pulmonary:     Effort: Pulmonary effort is normal.     Breath sounds: No stridor. No wheezing, rhonchi or rales.  Abdominal:     General: Abdomen is flat.     Palpations: There is no mass.     Tenderness: There is no abdominal tenderness. There is no guarding.     Hernia: No hernia is present.  Musculoskeletal:        General: No swelling.     Cervical back: Neck supple.  Lymphadenopathy:     Cervical: No cervical adenopathy.  Skin:    General: Skin is warm and dry.     Findings: No rash.  Neurological:     General: No focal deficit present.     Mental Status: She is alert. Mental status is at baseline.  Psychiatric:         Attention and Perception: Attention and perception normal.        Mood and Affect: Mood is anxious. Mood is not depressed. Affect is blunt. Affect is not labile or flat.        Speech: Speech normal.        Behavior: Behavior normal.        Thought Content: Thought content normal.        Cognition and Memory: Cognition normal.        Judgment: Judgment normal.     Lab Results  Component Value Date   WBC 5.7 03/15/2024   HGB 12.7 03/15/2024   HCT 37.4 03/15/2024   PLT 350.0 03/15/2024   GLUCOSE 87 03/15/2024   CHOL 216 (  H) 01/16/2022   TRIG 102.0 01/16/2022   HDL 74.20 01/16/2022   LDLCALC 121 (H) 01/16/2022   ALT 19 03/15/2024   AST 22 03/15/2024   NA 138 03/15/2024   K 4.0 03/15/2024   CL 103 03/15/2024   CREATININE 0.74 03/15/2024   BUN 13 03/15/2024   CO2 27 03/15/2024   TSH 1.53 03/15/2024   INR 1.1 02/29/2020   HGBA1C 5.2 03/15/2024    No results found.  Assessment & Plan:  Severe recurrent major depression without psychotic features (HCC) -     Ambulatory referral to Psychiatry -     CBC with Differential/Platelet; Future -     Hepatic function panel; Future  Psychophysiological insomnia- Will continue doxepin . -     Doxepin  HCl; Take 1 capsule (50 mg total) by mouth at bedtime.  Dispense: 90 capsule; Refill: 1 -     TSH; Future  Chronic hyperglycemia -     Basic metabolic panel with GFR; Future -     Hepatic function panel; Future -     Hemoglobin A1c; Future -     Cardio IQ Insulin Resistance Panel with Score; Future     Follow-up: Return in about 6 months (around 09/12/2024).  Debby Molt, MD "

## 2024-03-16 ENCOUNTER — Ambulatory Visit: Payer: Self-pay | Admitting: Internal Medicine

## 2024-03-16 NOTE — Progress Notes (Unsigned)
 "               [facility] [address] [place] [address] Phone: [phone]   Assessment and Plan:     1. Left hand pain (Primary) 2. Pain of left middle finger 3. De Quervain's tenosynovitis, left -Chronic with exacerbation, subsequent visit - Still most consistent with pain at left third DIP and mild DeQuervain's Tenosynovitis likely flared by physical activity with arts and crafts, holding scissors - No significant relief with meloxicam  course, so recommend discontinuing meloxicam  - Start prednisone Dosepak - Use topical Voltaren gel over areas of pain - Recommend continuing activity modification to decrease stress over wrist, finger.  May trial buddy taping -X-ray obtained in clinic.  My interpretation: No acute fracture or dislocation  Pertinent previous records reviewed include none   Follow Up: 4 to 6 weeks for reevaluation.  Could consider OT/PT versus CSI versus MRI   Subjective:   I, Nyaisha Simao, am serving as a neurosurgeon for Doctor Morene Mace   Chief Complaint: left hand  pain    HPI:    01/19/24 Patient is a 39 year old female with left hand  pain. Patient states pain for a year. She was using a lot of scissors and arts and crafts things. Pain starts thumb and goes up to her elbow. She isnt able to do arts and crafts or write with out pain. Decreased ROM. No numbness or tingling. No meds for the pain. Grip strength is WNL for her. She isnt able to the things she wants to   03/17/2024 Patient states pain is still present when she applies pressure to her hand. She sits for hours to paint and that still flares the pain    Relevant Historical Information: None pertinent  Additional pertinent review of systems negative.  Current Medications[1]   Objective:     Vitals:   03/17/24 1348  BP: 118/82  Pulse: 85  SpO2: 100%  Weight: 190 lb (86.2 kg)  Height: 5' 10 (1.778 m)      Body mass index is 27.26 kg/m.     Physical Exam:    General: Appears well, nad, nontoxic and pleasant Neuro:sensation intact, strength is 5/5 in upper extremities, muscle tone wnl Skin:no susupicious lesions or rashes   Left hand/Wrist:   No deformity or swelling appreciated. ROM  Ext 90, flexion 70, radial/ulnar deviation 30 TTP mildly third DIP nttp over the snuff box, dorsal carpals, volar carpals, radial styloid, ulnar styloid, 1st mcp, tfcc Negative Tinel's, Phalen's, Prayer Tests Negative finklestein Neg tfcc bounce test No pain with resisted ext, flex or deviation     Electronically signed by:  Re[facility]ina [facility] 2:06 PM 03/17/24     [1]  Current Outpatient Medications:    bisoprolol  (ZEBETA ) 5 MG tablet, Take 1 tablet (5 mg total) by mouth daily., Disp: 30 tablet, Rfl: 1   estradiol  (ESTRACE ) 1 MG tablet, Take 1 mg by mouth., Disp: , Rfl:    methylPREDNISolone  (MEDROL  DOSEPAK) 4 MG TBPK tablet, Take 6 tablets on day 1.  Take 5 tablets on day 2.  Take 4 tablets on day 3.  Take 3 tablets on day 4.  Take 2 tablets on day 5.  Take 1 tablet on day 6., Disp: 21 tablet, Rfl: 0   progesterone (PROMETRIUM) 200 MG capsule, Take 200 mg by mouth., Disp: , Rfl:    TREMFYA ONE-PRESS 100 MG/ML SOPN, , Disp: ,  Rfl:    buPROPion (WELLBUTRIN XL) 150 MG 24 hr tablet, Take 150 mg by mouth every morning., Disp: , Rfl:    doxepin  (SINEQUAN ) 50 MG capsule, Take 1 capsule (50 mg total) by mouth at bedtime., Disp: 90 capsule, Rfl: 1   meloxicam  (MOBIC ) 15 MG tablet, Take 1 tablet daily for 2 weeks.  If still in pain after 2 weeks, take 1 tablet daily for an additional 1 week., Disp: 30 tablet, Rfl: 0  Current Facility-Administered Medications:    botulinum toxin Type A  (BOTOX ) injection 155 Units, 155 Units, Intramuscular, Once, Sherryl Bouchard, NP  "

## 2024-03-17 ENCOUNTER — Ambulatory Visit

## 2024-03-17 ENCOUNTER — Ambulatory Visit: Admitting: Sports Medicine

## 2024-03-17 VITALS — BP 118/82 | HR 85 | Ht 70.0 in | Wt 190.0 lb

## 2024-03-17 DIAGNOSIS — M79645 Pain in left finger(s): Secondary | ICD-10-CM | POA: Diagnosis not present

## 2024-03-17 DIAGNOSIS — M654 Radial styloid tenosynovitis [de Quervain]: Secondary | ICD-10-CM

## 2024-03-17 DIAGNOSIS — M79642 Pain in left hand: Secondary | ICD-10-CM | POA: Diagnosis not present

## 2024-03-17 DIAGNOSIS — M79641 Pain in right hand: Secondary | ICD-10-CM

## 2024-03-17 MED ORDER — METHYLPREDNISOLONE 4 MG PO TBPK: ORAL_TABLET | ORAL | 0 refills | Status: AC

## 2024-03-17 NOTE — Patient Instructions (Signed)
 Prednisone Dosepak  Recommend Voltaren gel over areas of pain   Recommend buddy taping fingers to decrease stress  4-6 week follow up

## 2024-04-14 ENCOUNTER — Ambulatory Visit: Admitting: Sports Medicine
